# Patient Record
Sex: Female | Born: 1935 | Race: White | Hispanic: No | Marital: Married | Smoking: Never smoker
Health system: Southern US, Community
[De-identification: ages and names within clinical notes are randomized; demographics above are authoritative.]

## PROBLEM LIST (undated history)

## (undated) DIAGNOSIS — I10 Essential (primary) hypertension: Secondary | ICD-10-CM

## (undated) DIAGNOSIS — Z853 Personal history of malignant neoplasm of breast: Secondary | ICD-10-CM

## (undated) DIAGNOSIS — R413 Other amnesia: Secondary | ICD-10-CM

## (undated) DIAGNOSIS — G252 Other specified forms of tremor: Secondary | ICD-10-CM

## (undated) DIAGNOSIS — G459 Transient cerebral ischemic attack, unspecified: Secondary | ICD-10-CM

## (undated) DIAGNOSIS — G250 Essential tremor: Secondary | ICD-10-CM

## (undated) DIAGNOSIS — I7300 Raynaud's syndrome without gangrene: Secondary | ICD-10-CM

## (undated) DIAGNOSIS — E785 Hyperlipidemia, unspecified: Secondary | ICD-10-CM

## (undated) DIAGNOSIS — B0229 Other postherpetic nervous system involvement: Secondary | ICD-10-CM

## (undated) HISTORY — DX: Hyperlipidemia, unspecified: E78.5

## (undated) HISTORY — DX: Personal history of malignant neoplasm of breast: Z85.3

## (undated) HISTORY — DX: Other specified forms of tremor: G25.2

## (undated) HISTORY — DX: Raynaud's syndrome without gangrene: I73.00

## (undated) HISTORY — PX: ABDOMINAL HYSTERECTOMY: SHX81

## (undated) HISTORY — DX: Essential tremor: G25.0

## (undated) HISTORY — DX: Other postherpetic nervous system involvement: B02.29

---

## 1995-03-07 DIAGNOSIS — Z853 Personal history of malignant neoplasm of breast: Secondary | ICD-10-CM

## 1995-03-07 HISTORY — PX: BREAST LUMPECTOMY: SHX2

## 1995-03-07 HISTORY — DX: Personal history of malignant neoplasm of breast: Z85.3

## 1998-12-06 LAB — HM DEXA SCAN

## 2004-08-14 ENCOUNTER — Ambulatory Visit: Payer: Self-pay | Admitting: Internal Medicine

## 2004-10-06 ENCOUNTER — Ambulatory Visit: Payer: Self-pay | Admitting: Internal Medicine

## 2005-08-11 ENCOUNTER — Ambulatory Visit: Payer: Self-pay | Admitting: Internal Medicine

## 2005-09-06 DIAGNOSIS — B0229 Other postherpetic nervous system involvement: Secondary | ICD-10-CM

## 2005-09-06 HISTORY — DX: Other postherpetic nervous system involvement: B02.29

## 2005-09-15 ENCOUNTER — Ambulatory Visit: Payer: Self-pay | Admitting: Internal Medicine

## 2005-10-18 ENCOUNTER — Ambulatory Visit: Payer: Self-pay | Admitting: Internal Medicine

## 2005-12-23 ENCOUNTER — Ambulatory Visit: Payer: Self-pay | Admitting: Internal Medicine

## 2006-01-14 ENCOUNTER — Ambulatory Visit: Payer: Self-pay | Admitting: Internal Medicine

## 2007-01-24 ENCOUNTER — Telehealth (INDEPENDENT_AMBULATORY_CARE_PROVIDER_SITE_OTHER): Payer: Self-pay | Admitting: *Deleted

## 2007-01-25 ENCOUNTER — Ambulatory Visit: Payer: Self-pay | Admitting: Internal Medicine

## 2007-02-13 ENCOUNTER — Ambulatory Visit: Payer: Self-pay | Admitting: Internal Medicine

## 2007-08-29 ENCOUNTER — Ambulatory Visit: Payer: Self-pay | Admitting: Internal Medicine

## 2007-08-29 DIAGNOSIS — E785 Hyperlipidemia, unspecified: Secondary | ICD-10-CM | POA: Insufficient documentation

## 2007-08-29 DIAGNOSIS — K5909 Other constipation: Secondary | ICD-10-CM | POA: Insufficient documentation

## 2007-08-29 DIAGNOSIS — M810 Age-related osteoporosis without current pathological fracture: Secondary | ICD-10-CM | POA: Insufficient documentation

## 2007-08-29 DIAGNOSIS — Z853 Personal history of malignant neoplasm of breast: Secondary | ICD-10-CM

## 2008-01-04 ENCOUNTER — Telehealth (INDEPENDENT_AMBULATORY_CARE_PROVIDER_SITE_OTHER): Payer: Self-pay | Admitting: *Deleted

## 2008-04-30 ENCOUNTER — Ambulatory Visit: Payer: Self-pay | Admitting: Internal Medicine

## 2008-04-30 DIAGNOSIS — G250 Essential tremor: Secondary | ICD-10-CM

## 2008-04-30 DIAGNOSIS — L570 Actinic keratosis: Secondary | ICD-10-CM

## 2008-05-02 LAB — CONVERTED CEMR LAB
Albumin: 3.9 g/dL (ref 3.5–5.2)
Basophils Absolute: 0 10*3/uL (ref 0.0–0.1)
CO2: 30 meq/L (ref 19–32)
Calcium: 9.4 mg/dL (ref 8.4–10.5)
GFR calc Af Amer: 89 mL/min
GFR calc non Af Amer: 74 mL/min
Glucose, Bld: 92 mg/dL (ref 70–99)
Lymphocytes Relative: 15.3 % (ref 12.0–46.0)
MCHC: 34.3 g/dL (ref 30.0–36.0)
Monocytes Absolute: 0.4 10*3/uL (ref 0.1–1.0)
Monocytes Relative: 7.3 % (ref 3.0–12.0)
Neutro Abs: 4.2 10*3/uL (ref 1.4–7.7)
Platelets: 155 10*3/uL (ref 150–400)
Potassium: 4.4 meq/L (ref 3.5–5.1)
RDW: 12.6 % (ref 11.5–14.6)
Sodium: 141 meq/L (ref 135–145)
TSH: 1.94 microintl units/mL (ref 0.35–5.50)

## 2008-05-14 ENCOUNTER — Encounter: Payer: Self-pay | Admitting: Family Medicine

## 2008-05-14 ENCOUNTER — Ambulatory Visit: Payer: Self-pay | Admitting: Internal Medicine

## 2008-05-24 ENCOUNTER — Encounter (INDEPENDENT_AMBULATORY_CARE_PROVIDER_SITE_OTHER): Payer: Self-pay | Admitting: *Deleted

## 2008-11-14 ENCOUNTER — Telehealth: Payer: Self-pay | Admitting: Family Medicine

## 2009-04-22 ENCOUNTER — Telehealth: Payer: Self-pay | Admitting: Internal Medicine

## 2009-06-04 ENCOUNTER — Encounter: Payer: Self-pay | Admitting: Internal Medicine

## 2009-06-04 ENCOUNTER — Ambulatory Visit: Payer: Self-pay | Admitting: Internal Medicine

## 2009-06-06 ENCOUNTER — Encounter: Payer: Self-pay | Admitting: Internal Medicine

## 2009-09-15 ENCOUNTER — Ambulatory Visit: Payer: Self-pay | Admitting: Internal Medicine

## 2009-12-01 ENCOUNTER — Telehealth: Payer: Self-pay | Admitting: Internal Medicine

## 2010-02-13 ENCOUNTER — Telehealth: Payer: Self-pay | Admitting: Internal Medicine

## 2010-02-27 ENCOUNTER — Ambulatory Visit: Payer: Self-pay | Admitting: Internal Medicine

## 2010-02-27 ENCOUNTER — Encounter: Payer: Self-pay | Admitting: Internal Medicine

## 2010-03-11 ENCOUNTER — Ambulatory Visit: Payer: Self-pay | Admitting: General Surgery

## 2010-03-12 ENCOUNTER — Telehealth: Payer: Self-pay | Admitting: Internal Medicine

## 2010-03-20 ENCOUNTER — Encounter: Payer: Self-pay | Admitting: Internal Medicine

## 2010-03-20 ENCOUNTER — Ambulatory Visit: Payer: Self-pay | Admitting: General Surgery

## 2010-03-27 ENCOUNTER — Encounter: Payer: Self-pay | Admitting: Internal Medicine

## 2010-07-24 ENCOUNTER — Ambulatory Visit: Payer: Self-pay | Admitting: Internal Medicine

## 2010-10-06 NOTE — Letter (Signed)
Summary: [facility]  [facility]   Imported By: Lanelle Bal 04/06/2010 10:07:48  _____________________________________________________________________  External Attachment:    Type:   Image     Comment:   External Document  Appended Document: [facility] doing well after cholecystectomy

## 2010-10-06 NOTE — Assessment & Plan Note (Signed)
Summary: STOMACH/CLE   Vital Signs:  Patient profile:   77 year old female Weight:      136 pounds BMI:     22.03 Temp:     99.3 degrees F oral Pulse rate:   76 / minute Pulse rhythm:   regular BP sitting:   140 / 80  (left arm) Cuff size:   regular  Vitals Entered By: Mervin Hack CMA Duncan Dull) (July 24, 2010 8:55 AM) CC: abdominal pain x8days   History of Present Illness: Plans to fly to [place] to see daughter for Thanksgiving--concerned about several things  Has had sore throat for 3 days Got so bad she could barely swallow yesterday but better today no fever but does feel mildly sick some chest congestion occ cough--got up some yellow mucus No sig head or nasal congestion  More concerned about stomach issue  ~2 weeks ago started with terrible stomach pain after breakfast had freq watery stools then---felt faint and diaphoretic Then vomited and was wiped out 2 days later it recurred though not as bad, and then again another couple of days later Always after eating didn't seem to be related to what she ate  Hasn't been moving bowels in between Doesn't remember being constipated before this No fever No heartburn  Allergies: 1)  * Fosamax 2)  Percocet (Oxycodone-Acetaminophen)  Past History:  Past medical, surgical, family and social histories (including risk factors) reviewed for relevance to current acute and chronic problems.  Past Medical History: Reviewed history from 04/30/2008 and no changes required. Breast cancer, hx of--7/96 Hyperlipidemia Osteoporosis Raynaud's Post herpetic neuralgia--1/07 Essential tremor  Past Surgical History: Reviewed history from 09/04/2007 and no changes required. 7/96      lumpectomy - surgery at [facility]     ~ 1970  hysterectomy 4/00      DEXA  Family History: Reviewed history from 09/04/2007 and no changes required. Father: Had macular degeneration, prostate CA Mother: Died AAA, had HTN, osteoporosis Family  history of cerebral aneurysms No  tremor  Social History: Reviewed history from 09/04/2007 and no changes required. Never Smoked Alcohol use-yes, occasional wine, martini Marital Status: Married Children: 2 Occupation: Retired [employer], (occasional part time) Regular exercise-yes, trying - does lift weights Wears seat belt  Review of Systems       appetite is fine weight fairly stable  Physical Exam  General:  alert.  NAD Head:  no sinus tenderness Ears:  R ear normal and L ear normal.   Nose:  no sig inflammation Mouth:  no sig injection or exudates no lesions Neck:  supple, no masses, and no cervical lymphadenopathy.   Lungs:  normal respiratory effort, no intercostal retractions, no accessory muscle use, normal breath sounds, no crackles, and no wheezes.   Abdomen:  soft, normal bowel sounds, and no masses.   Mild diffuse tenderness mostly across upper abdomen Psych:  normally interactive, good eye contact, not anxious appearing, and not depressed appearing.     Impression & Recommendations:  Problem # 1:  ABDOMINAL PAIN (ICD-789.00) Assessment New symptoms suggestive of partial obstruction KUB confirms lots of stool in bowel will have her treat with miralax till clear, then continue daily  Orders: T-1 View Abdomen (KUB) (74000TC)  Problem # 2:  SORE THROAT (ICD-462) Assessment: New probably viral  she has prior amox Rx---she will take this for signs of secondary bacterial infection  Complete Medication List: 1)  Evista 60 Mg Tabs (Raloxifene hcl) .... Take 1 tablet by mouth once  a day 2)  Miralax Powd (Polyethylene glycol 3350) .Marland Kitchen.. 1 capful with water daily to prevent constipation  Patient Instructions: 1)  Please take a full capful of miralax with water three times a day till your bowels really seem to be emptied out. Then please take the miralax daily to prevent similar problems 2)  Please return for your yearly check up   Orders Added: 1)  T-1 View  Abdomen (KUB) [74000TC] 2)  Est. Patient Level IV [88416]   Immunization History:  Influenza Immunization History:    Influenza:  historical (06/06/2010)   Immunization History:  Influenza Immunization History:    Influenza:  Historical (06/06/2010)  Current Allergies (reviewed today): * FOSAMAX PERCOCET (OXYCODONE-ACETAMINOPHEN)

## 2010-10-06 NOTE — Assessment & Plan Note (Signed)
Summary: CHEST CONGESTION/RBH   Vital Signs:  Patient profile:   77 year old female Height:      66 inches Weight:      140 pounds BMI:     22.68 Temp:     100.3 degrees F oral Pulse rate:   64 / minute Pulse rhythm:   regular Resp:     12 per minute BP sitting:   140 / 80  (left arm) Cuff size:   regular  Vitals Entered By: Mervin Hack CMA Duncan Dull) (September 15, 2009 11:30 AM) CC: fever, cold   History of Present Illness: Feels sick Started a couple of days ago Started in chest Some headache, sore throat Mostly pain in chest with cough cough is dry  No fever, chills or night sweats Feels a little SOB from the chest congestion No ear pain  has tried some vitamin C and zinc Toddies with honey and liquor--does calm her cough some  Allergies: 1)  * Fosamax 2)  Percocet (Oxycodone-Acetaminophen)  Past History:  Past medical, surgical, family and social histories (including risk factors) reviewed for relevance to current acute and chronic problems.  Past Medical History: Reviewed history from 04/30/2008 and no changes required. Breast cancer, hx of--7/96 Hyperlipidemia Osteoporosis Raynaud's Post herpetic neuralgia--1/07 Essential tremor  Past Surgical History: Reviewed history from 09/04/2007 and no changes required. 7/96      lumpectomy - surgery at [facility]     ~ 1970  hysterectomy 4/00      DEXA  Family History: Reviewed history from 09/04/2007 and no changes required. Father: Had macular degeneration, prostate CA Mother: Died AAA, had HTN, osteoporosis Family history of cerebral aneurysms No  tremor  Social History: Reviewed history from 09/04/2007 and no changes required. Never Smoked Alcohol use-yes, occasional wine, martini Marital Status: Married Children: 2 Occupation: Retired [employer], (occasional part time) Regular exercise-yes, trying - does lift weights Wears seat belt  Review of Systems       No N/V No diarrhea appetite is  okay  Physical Exam  General:  alert.  NAD Head:  no sinus tenderness Ears:  R ear normal and L ear normal.   Nose:  mild nasal congestion Mouth:  no erythema and no exudates.   Neck:  supple, no masses, and no cervical lymphadenopathy.   Lungs:  normal respiratory effort, no intercostal retractions, no accessory muscle use, normal breath sounds, no crackles, and no wheezes.     Impression & Recommendations:  Problem # 1:  BRONCHITIS- ACUTE (ICD-466.0) Assessment New  seems to be viral discussed supportive care is going on cruise in 5 days---will give Rx in case she worsens before trip  Her updated medication list for this problem includes:    Amoxicillin 500 Mg Tabs (Amoxicillin) .Marland Kitchen... 2 tabs by mouth two times a day for bronchitis  Complete Medication List: 1)  Evista 60 Mg Tabs (Raloxifene hcl) .... Take 1 tablet by mouth once a day 2)  Amoxicillin 500 Mg Tabs (Amoxicillin) .... 2 tabs by mouth two times a day for bronchitis  Patient Instructions: 1)  Please start the amoxicillin if your cough worsens or you start bringing up significant amounts of yellow or green mucus 2)  Please schedule a follow-up appointment as needed .  Prescriptions: AMOXICILLIN 500 MG TABS (AMOXICILLIN) 2 tabs by mouth two times a day for bronchitis  #40 x 0   Entered and Authorized by:   Cindee Salt MD   Signed by:   Gerlene Burdock  Dia Crawford MD on 09/15/2009   Method used:   Electronically to        [facility]* (retail)       [address]       [place], [place]  [address]       Ph: [phone]       Fax: [phone]   RxID:   [id number]   Current Allergies (reviewed today): * FOSAMAX PERCOCET (OXYCODONE-ACETAMINOPHEN)

## 2010-10-06 NOTE — Op Note (Signed)
Summary: Laparoscopic Cholecystectomy/[facility]  Laparoscopic Cholecystectomy/[facility]   Imported By: Lanelle Bal 03/27/2010 12:05:29  _____________________________________________________________________  External Attachment:    Type:   Image     Comment:   External Document

## 2010-10-06 NOTE — Progress Notes (Signed)
Summary: evista  Phone Note Refill Request Call back at Home Phone [phone] Message from:  Patient on December 01, 2009 12:03 PM  Refills Requested: Medication #1:  EVISTA 60 MG TABS Take 1 tablet by mouth once a day Patient wants this sent to [facility]  Initial call taken by: Melody Comas,  December 01, 2009 12:03 PM  Follow-up for Phone Call        Let her know Rx sent Follow-up by: Cindee Salt MD,  December 01, 2009 2:14 PM  Additional Follow-up for Phone Call Additional follow up Details #1::        Spoke with patient and advised results.  Additional Follow-up by: Mervin Hack CMA Duncan Dull),  December 01, 2009 4:31 PM    Prescriptions: EVISTA 60 MG TABS (RALOXIFENE HCL) Take 1 tablet by mouth once a day  #90 x 3   Entered and Authorized by:   Cindee Salt MD   Signed by:   Cindee Salt MD on 12/01/2009   Method used:   Electronically to        [facility]* (mail-order)             ,          Ph: [phone]       Fax: [phone]   RxID:   [id number]

## 2010-10-06 NOTE — Progress Notes (Signed)
Summary: BP up yesterday  Phone Note Call from Patient Call back at Home Phone [phone]   Caller: Patient Call For: Kylie Salt MD Summary of Call: Pt is scheduled for gallbladder surgery on 7/15.  She went to pre admission appt yesterday and her BP was 186/64 and 178/84.  They told her to let you know. Initial call taken by: Lowella Petties CMA,  March 12, 2010 4:35 PM  Follow-up for Phone Call        discussed with husband No history of problems with BP in past so this is probably isolated and related to nerves with upcoming procedure on 7/15 will observe only Follow-up by: Kylie Salt MD,  March 12, 2010 5:48 PM

## 2010-10-06 NOTE — Progress Notes (Signed)
  Phone Note Call from Patient Call back at Home Phone [phone]   Caller: Patient Summary of Call: Called my home last night, I was out at the time Having fairly severe abd pain  I spoke to her husband  ~10PM fairly severe pain around entire abdomen at lower ribs some nausea No fever No vomiting discussed ER if pain worsens-----she is wondering if it is gall bladder and that is certainly possible Initial call taken by: Cindee Salt MD,  February 13, 2010 7:56 AM  Follow-up for Phone Call        Better this AM took ibuprofen, then tylenol feels back to normal actually has had several attacks like this but this was the worst going to beach tomorrow  will check ultrasound on return surgery if positive Follow-up by: Cindee Salt MD,  February 13, 2010 8:02 AM  New Problems: ABDOMINAL PAIN RIGHT UPPER QUADRANT (ICD-789.01)   New Problems: ABDOMINAL PAIN RIGHT UPPER QUADRANT (ICD-789.01)

## 2011-01-01 ENCOUNTER — Other Ambulatory Visit: Payer: Self-pay | Admitting: Internal Medicine

## 2011-11-22 ENCOUNTER — Encounter: Payer: Self-pay | Admitting: Internal Medicine

## 2011-11-22 ENCOUNTER — Ambulatory Visit (INDEPENDENT_AMBULATORY_CARE_PROVIDER_SITE_OTHER): Payer: 59 | Admitting: Internal Medicine

## 2011-11-22 VITALS — BP 148/88 | HR 74 | Temp 98.4°F | Wt 138.0 lb

## 2011-11-22 DIAGNOSIS — R1011 Right upper quadrant pain: Secondary | ICD-10-CM

## 2011-11-22 LAB — CBC WITH DIFFERENTIAL/PLATELET
Basophils Absolute: 0 10*3/uL (ref 0.0–0.1)
Eosinophils Absolute: 0 10*3/uL (ref 0.0–0.7)
HCT: 39.1 % (ref 36.0–46.0)
Hemoglobin: 13 g/dL (ref 12.0–15.0)
Lymphs Abs: 0.6 10*3/uL — ABNORMAL LOW (ref 0.7–4.0)
MCHC: 33.2 g/dL (ref 30.0–36.0)
Monocytes Absolute: 0.7 10*3/uL (ref 0.1–1.0)
Monocytes Relative: 7.8 % (ref 3.0–12.0)
Neutro Abs: 7.5 10*3/uL (ref 1.4–7.7)
Platelets: 160 10*3/uL (ref 150.0–400.0)
RDW: 13.9 % (ref 11.5–14.6)

## 2011-11-22 LAB — BASIC METABOLIC PANEL
BUN: 12 mg/dL (ref 6–23)
CO2: 29 mEq/L (ref 19–32)
Calcium: 9.5 mg/dL (ref 8.4–10.5)
GFR: 65.46 mL/min (ref >60.00)
Glucose, Bld: 105 mg/dL — ABNORMAL HIGH (ref 70–99)

## 2011-11-22 LAB — HEPATIC FUNCTION PANEL
Albumin: 3.7 g/dL (ref 3.5–5.2)
Alkaline Phosphatase: 79 U/L (ref 39–117)
Total Protein: 7 g/dL (ref 6.0–8.3)

## 2011-11-22 LAB — AMYLASE: Amylase: 71 U/L (ref 27–131)

## 2011-11-22 NOTE — Assessment & Plan Note (Signed)
Has pain which radiates around chest and back recreating gallbladder pain from over a year ago Did have cholecystectomy though Doesn't seem to be pulmonary or stomach related Area not consistent with kidney stone though colicky  Will check labs RUQ ultrasound ??choledocholithiasis, other obstruction?

## 2011-11-22 NOTE — Progress Notes (Signed)
  Subjective:    Patient ID: Kylie Kim, female    DOB: June 07, 1928, 78 y.o.   MRN: [id number]  HPI Having "pain attacks" across breasts and back Severe and she feels like she will faint and vomit Started 3 days ago Gets spells that last about 15 minutes--then ease up with nausea for a while Tried zantac and tylenol--no clear help Had 2 again in night last night prompting visit Feels just like pain she had before gall bladder surgery  Not related to meals Appetite is okay---limiting diet due to upper teeth implants Bowels have been fine   no fever No cough or SOB Current Outpatient Prescriptions on File Prior to Visit  Medication Sig Dispense Refill  . EVISTA 60 MG tablet TAKE 1 TABLET DAILY  90 tablet  2    Allergies  Allergen Reactions  . Alendronate Sodium     REACTION: unspecified  . Oxycodone-Acetaminophen     REACTION: unspecified    Past Medical History  Diagnosis Date  . History of breast cancer 07/96  . Hyperlipidemia   . Osteoporosis   . Raynaud's disease   . Post herpetic neuralgia 01/07  . Essential and other specified forms of tremor     Past Surgical History  Procedure Date  . Breast lumpectomy 07/96    surgery at [facility]  . Abdominal hysterectomy     No family history on file.  History   Social History  . Marital Status: Married    Spouse Name: N/A    Number of Children: 2  . Years of Education: N/A   Occupational History  . retired [employer]    Social History Main Topics  . Smoking status: Never Smoker   . Smokeless tobacco: Never Used  . Alcohol Use: Yes  . Drug Use: No  . Sexually Active: Not on file   Other Topics Concern  . Not on file   Social History Narrative  . No narrative on file   Review of Systems Had implant surgery in mouth---soft diet and amoxil. Just finished the amoxil yesterday Had been on pain pills last week---off them before the pains started No rash    Objective:   Physical Exam  Constitutional:  She appears well-developed and well-nourished. No distress.  Neck: Normal range of motion. Neck supple.  Pulmonary/Chest: Effort normal and breath sounds normal. No respiratory distress. She has no wheezes. She has no rales. She exhibits no tenderness.       No dullness  Abdominal: Soft. Bowel sounds are normal. She exhibits no distension and no mass. There is no rebound and no guarding.       Mild RUQ tenderness that recreates pain to some degree  Lymphadenopathy:    She has no cervical adenopathy.          Assessment & Plan:

## 2011-11-22 NOTE — Patient Instructions (Signed)
Please set up the ultrasound

## 2011-11-23 ENCOUNTER — Encounter: Payer: Self-pay | Admitting: Internal Medicine

## 2011-11-23 ENCOUNTER — Ambulatory Visit: Payer: Self-pay | Admitting: Internal Medicine

## 2011-11-24 ENCOUNTER — Encounter: Payer: Self-pay | Admitting: *Deleted

## 2012-05-01 ENCOUNTER — Other Ambulatory Visit: Payer: Self-pay | Admitting: Internal Medicine

## 2012-07-17 ENCOUNTER — Telehealth: Payer: Self-pay

## 2012-07-17 DIAGNOSIS — Z853 Personal history of malignant neoplasm of breast: Secondary | ICD-10-CM

## 2012-07-17 NOTE — Telephone Encounter (Signed)
Pt request date of last mammogram.I see order for mammo 11/22/11 but do not see results.Please advise.

## 2012-07-17 NOTE — Telephone Encounter (Signed)
I don't see it either Can check with the imaging center Okay to order yearly diagnostic mammogram if she is due

## 2012-07-17 NOTE — Telephone Encounter (Signed)
Patient called to have you place an order for a Diagnostic mammogram for history  Of breast cancer. She tried to make her own appt and they wouldn't let her order it said you had to. Wants am appt whrn we schedule it.

## 2012-07-17 NOTE — Telephone Encounter (Signed)
Last mammogram 06/04/2009, per pt she stated she had one last year, I didn't see it. Pt states she's a breast cancer survivor and wouldn't miss a year. Please advise

## 2012-07-26 ENCOUNTER — Ambulatory Visit: Payer: Self-pay | Admitting: Internal Medicine

## 2012-07-27 ENCOUNTER — Encounter: Payer: Self-pay | Admitting: Internal Medicine

## 2012-07-31 ENCOUNTER — Encounter: Payer: Self-pay | Admitting: *Deleted

## 2013-02-27 ENCOUNTER — Other Ambulatory Visit: Payer: Self-pay | Admitting: Internal Medicine

## 2013-02-27 MED ORDER — RALOXIFENE HCL 60 MG PO TABS: 60.0000 mg | ORAL_TABLET | Freq: Every day | ORAL | Status: DC

## 2013-02-27 NOTE — Telephone Encounter (Signed)
Pt scheduled appt

## 2013-04-23 ENCOUNTER — Encounter: Payer: Self-pay | Admitting: Internal Medicine

## 2013-04-23 ENCOUNTER — Ambulatory Visit (INDEPENDENT_AMBULATORY_CARE_PROVIDER_SITE_OTHER): Payer: Medicare Other | Admitting: Internal Medicine

## 2013-04-23 VITALS — BP 140/80 | HR 68 | Temp 97.8°F | Ht 66.0 in | Wt 138.0 lb

## 2013-04-23 DIAGNOSIS — Z853 Personal history of malignant neoplasm of breast: Secondary | ICD-10-CM

## 2013-04-23 DIAGNOSIS — Z0000 Encounter for general adult medical examination without abnormal findings: Secondary | ICD-10-CM | POA: Insufficient documentation

## 2013-04-23 DIAGNOSIS — G250 Essential tremor: Secondary | ICD-10-CM

## 2013-04-23 DIAGNOSIS — G501 Atypical facial pain: Secondary | ICD-10-CM

## 2013-04-23 LAB — CBC WITH DIFFERENTIAL/PLATELET
Basophils Absolute: 0 10*3/uL (ref 0.0–0.1)
HCT: 37.8 % (ref 36.0–46.0)
Hemoglobin: 12.8 g/dL (ref 12.0–15.0)
Lymphs Abs: 0.9 10*3/uL (ref 0.7–4.0)
MCV: 90.9 fl (ref 78.0–100.0)
Monocytes Absolute: 0.4 10*3/uL (ref 0.1–1.0)
Monocytes Relative: 6.9 % (ref 3.0–12.0)
Neutro Abs: 4.1 10*3/uL (ref 1.4–7.7)
RDW: 13.6 % (ref 11.5–14.6)

## 2013-04-23 LAB — BASIC METABOLIC PANEL
CO2: 28 mEq/L (ref 19–32)
Chloride: 103 mEq/L (ref 96–112)
GFR: 76.1 mL/min (ref >60.00)
Glucose, Bld: 89 mg/dL (ref 70–99)
Potassium: 3.8 mEq/L (ref 3.5–5.1)
Sodium: 138 mEq/L (ref 135–145)

## 2013-04-23 LAB — HEPATIC FUNCTION PANEL
AST: 22 U/L (ref 0–37)
Albumin: 4.2 g/dL (ref 3.5–5.2)
Alkaline Phosphatase: 72 U/L (ref 39–117)
Total Protein: 7.1 g/dL (ref 6.0–8.3)

## 2013-04-23 NOTE — Assessment & Plan Note (Signed)
Mild in head only No Rx indicated as no functional problems

## 2013-04-23 NOTE — Assessment & Plan Note (Signed)
Left V2 by her history Never had rash Discussed it might not be shingles Only when supine Doesn't want Rx

## 2013-04-23 NOTE — Progress Notes (Signed)
Subjective:    Patient ID: Kylie Kim, female    DOB: 1927-10-14, 79 y.o.   MRN: [id number]  HPI Here for physical  Had bad bout of shingles pain--but never had the rash Pain persists in left V2 dermatome---only notices if she is supine Gets burning and itching Worse with heat Does ease up over time so she doesn't want Rx  Gets sense of swelling and knots in right breast--where she had lumpectomy Did have mammo in November  Feels she had "a mini stroke" 6 months ago or so Came in after errands--- had headache and then zig zag vision Tried 2 advil--then couldn't see much for about a minute (due to the zig zag pattern) Went away with a minute or so Brief trouble forming a thought No recurrent symptoms  Current Outpatient Prescriptions on File Prior to Visit  Medication Sig Dispense Refill  . raloxifene (EVISTA) 60 MG tablet Take 1 tablet (60 mg total) by mouth daily.  90 tablet  1   No current facility-administered medications on file prior to visit.    Allergies  Allergen Reactions  . Alendronate Sodium     REACTION: unspecified  . Oxycodone-Acetaminophen     REACTION: unspecified    Past Medical History  Diagnosis Date  . History of breast cancer 07/96  . Hyperlipidemia   . Osteoporosis   . Raynaud's disease   . Post herpetic neuralgia 01/07  . Essential and other specified forms of tremor     Past Surgical History  Procedure Laterality Date  . Breast lumpectomy  07/96    surgery at [facility]  . Abdominal hysterectomy      No family history on file.  History   Social History  . Marital Status: Married    Spouse Name: N/A    Number of Children: 2  . Years of Education: N/A   Occupational History  . retired [employer]    Social History Main Topics  . Smoking status: Never Smoker   . Smokeless tobacco: Never Used  . Alcohol Use: Yes  . Drug Use: No  . Sexual Activity: Not on file   Other Topics Concern  . Not on file   Social History  Narrative   Has living will   Would want husband, then son Link Snuffer, should make health care decisions.   Would accept resuscitation attempts but no prolonged ventilation   Probably would not want tube feeds if cognitively unaware         Review of Systems  Constitutional: Negative for fatigue and unexpected weight change.       Wears seat belt  HENT: Positive for hearing loss and dental problem. Negative for congestion, rhinorrhea and tinnitus.        Lots of visits to periodontist and dentist ?slight hearing loss  Eyes: Positive for visual disturbance.       Regular with eye exams  Respiratory: Negative for cough, chest tightness and shortness of breath.   Cardiovascular: Negative for chest pain, palpitations and leg swelling.  Gastrointestinal: Negative for nausea, vomiting, abdominal pain, constipation and blood in stool.       No heartburn  Endocrine: Negative for cold intolerance and heat intolerance.  Genitourinary: Positive for frequency. Negative for difficulty urinating.       Uses liner for slight urge incontinence No sexual problems  Musculoskeletal: Positive for arthralgias. Negative for back pain and joint swelling.       Some arthritis pain in fingers now--no meds  Skin:  Negative for rash.       Sees Dr Roseanne Kaufman  Allergic/Immunologic: Negative for environmental allergies and immunocompromised state.  Neurological: Positive for headaches. Negative for dizziness, syncope, weakness, light-headedness and numbness.  Hematological: Negative for adenopathy. Does not bruise/bleed easily.  Psychiatric/Behavioral: Negative for sleep disturbance and dysphoric mood. The patient is not nervous/anxious.        Objective:   Physical Exam  Constitutional: She is oriented to person, place, and time. She appears well-developed and well-nourished. No distress.  HENT:  Head: Normocephalic and atraumatic.  Right Ear: External ear normal.  Left Ear: External ear normal.   Mouth/Throat: Oropharynx is clear and moist. No oropharyngeal exudate.  Eyes: Conjunctivae and EOM are normal. Pupils are equal, round, and reactive to light.  Neck: Normal range of motion. Neck supple. No thyromegaly present.  Cardiovascular: Normal rate, regular rhythm, normal heart sounds and intact distal pulses.  Exam reveals no gallop.   No murmur heard. Pulmonary/Chest: Effort normal and breath sounds normal. No respiratory distress. She has no wheezes. She has no rales.  Abdominal: Soft. There is no tenderness.  Genitourinary:  Thickening in right breast around 8-9 o'clock Mild cystic changes bilaterally No sig mass  Musculoskeletal: She exhibits no edema and no tenderness.  Lymphadenopathy:    She has no cervical adenopathy.    She has no axillary adenopathy.  Neurological: She is alert and oriented to person, place, and time.  Skin: No rash noted. No erythema.  Psychiatric: She has a normal mood and affect. Her behavior is normal.          Assessment & Plan:

## 2013-04-23 NOTE — Assessment & Plan Note (Signed)
I only feel radiation damaged tissue Reassured her May want to stop mammograms before too long

## 2013-04-23 NOTE — Assessment & Plan Note (Signed)
Healthy Counseling done 

## 2013-05-23 ENCOUNTER — Telehealth: Payer: Self-pay

## 2013-05-23 NOTE — Telephone Encounter (Signed)
Pt said her computer is in shop and cannot get to mychart. Pt notifed as instructed by mychart lab notes. Pt request copy mailed to verified home address. Done.

## 2013-07-12 ENCOUNTER — Other Ambulatory Visit: Payer: Self-pay

## 2013-08-14 ENCOUNTER — Other Ambulatory Visit: Payer: Self-pay | Admitting: *Deleted

## 2013-08-14 MED ORDER — RALOXIFENE HCL 60 MG PO TABS: 60.0000 mg | ORAL_TABLET | Freq: Every day | ORAL | Status: DC

## 2013-10-12 ENCOUNTER — Ambulatory Visit: Payer: Self-pay | Admitting: Internal Medicine

## 2013-10-15 ENCOUNTER — Encounter: Payer: Self-pay | Admitting: Internal Medicine

## 2013-12-20 ENCOUNTER — Ambulatory Visit: Payer: Self-pay | Admitting: Podiatry

## 2013-12-20 ENCOUNTER — Ambulatory Visit: Payer: Self-pay | Admitting: Podiatrist

## 2013-12-27 ENCOUNTER — Ambulatory Visit: Payer: Self-pay | Admitting: Podiatry

## 2014-01-14 ENCOUNTER — Ambulatory Visit (INDEPENDENT_AMBULATORY_CARE_PROVIDER_SITE_OTHER): Payer: Medicare Other | Admitting: Podiatry

## 2014-01-14 ENCOUNTER — Encounter: Payer: Self-pay | Admitting: Podiatry

## 2014-01-14 VITALS — BP 153/79 | HR 82 | Resp 16 | Ht 60.0 in

## 2014-01-14 DIAGNOSIS — Q828 Other specified congenital malformations of skin: Secondary | ICD-10-CM

## 2014-01-14 NOTE — Progress Notes (Signed)
   Subjective:    Patient ID: Kylie Kim, female    DOB: 1928/05/22, 80 y.o.   MRN: [id number]  HPI Comments: i have a place on the bottom of my left foot. It hurts when i put weight on it. i use padding on it and neosporin. Its remained the same. Ive had it for 6 months to 1 year.   Foot Pain      Review of Systems  All other systems reviewed and are negative.      Objective:   Physical Exam: I have reviewed her past medical history medications allergies surgeries and social history. Pulses are palpable bilateral. Neurologic sensorium is intact. Deep tendon reflexes are intact. Muscle strength is 5 over 5 dorsiflexors plantar flexors inverters everters all intrinsic musculature is intact. Orthopedic evaluation demonstrates all joints distal to the ankle a full range of motion without crepitation. Mild hammertoe deformities noted bilateral. Cutaneous evaluation demonstrates supple well hydrated cutis reactive hyperkeratotic lesion sub-second metatarsophalangeal joint that appears to be a porokeratotic lesion.          Assessment & Plan:  Assessment: Porokeratosis.  Plan: Debridement of porokeratosis and placement padding.

## 2014-06-10 ENCOUNTER — Other Ambulatory Visit: Payer: Self-pay | Admitting: Internal Medicine

## 2014-06-28 ENCOUNTER — Ambulatory Visit (INDEPENDENT_AMBULATORY_CARE_PROVIDER_SITE_OTHER): Payer: Medicare Other

## 2014-06-28 DIAGNOSIS — Z23 Encounter for immunization: Secondary | ICD-10-CM

## 2014-10-23 ENCOUNTER — Other Ambulatory Visit: Payer: Self-pay | Admitting: Internal Medicine

## 2015-01-06 ENCOUNTER — Encounter: Payer: Self-pay | Admitting: Internal Medicine

## 2015-01-06 ENCOUNTER — Ambulatory Visit (INDEPENDENT_AMBULATORY_CARE_PROVIDER_SITE_OTHER): Payer: Medicare Other | Admitting: Internal Medicine

## 2015-01-06 VITALS — BP 150/80 | HR 72 | Temp 98.0°F | Ht 60.0 in | Wt 140.0 lb

## 2015-01-06 DIAGNOSIS — G501 Atypical facial pain: Secondary | ICD-10-CM | POA: Diagnosis not present

## 2015-01-06 DIAGNOSIS — Z7189 Other specified counseling: Secondary | ICD-10-CM

## 2015-01-06 DIAGNOSIS — Z23 Encounter for immunization: Secondary | ICD-10-CM | POA: Diagnosis not present

## 2015-01-06 DIAGNOSIS — Z0000 Encounter for general adult medical examination without abnormal findings: Secondary | ICD-10-CM | POA: Diagnosis not present

## 2015-01-06 DIAGNOSIS — Z853 Personal history of malignant neoplasm of breast: Secondary | ICD-10-CM | POA: Diagnosis not present

## 2015-01-06 DIAGNOSIS — G250 Essential tremor: Secondary | ICD-10-CM

## 2015-01-06 DIAGNOSIS — G252 Other specified forms of tremor: Secondary | ICD-10-CM

## 2015-01-06 DIAGNOSIS — R251 Tremor, unspecified: Secondary | ICD-10-CM

## 2015-01-06 NOTE — Assessment & Plan Note (Signed)
Mild stable head tremor No meds needed

## 2015-01-06 NOTE — Assessment & Plan Note (Signed)
Mild and intermittent No bad enough for treatment

## 2015-01-06 NOTE — Progress Notes (Signed)
Subjective:    Patient ID: Kylie Kim, female    DOB: 1927/09/25, 81 y.o.   MRN: [id number]  HPI Here for Medicare wellness and follow up of chronic medical problems Reviewed form and advanced directives Only sees eye doctor Occasional drink--at most one a day No tobacco products Tries to [facility]s Vision okay---early cataract. Discussed avoiding the bifocals Hearing is okay No falls No depression or anhedonia No major cognitive problems--memory is not as good though  Still has intermittent pain in her face--around left ear Burning and itchingh Seems more noticeable with heat--like after shower May go weeks pain free--then other times it hurts every few days  Tremor seems to be somewhat better It really doesn't bother her at all  She continues on the evista She has always just kept up with the mammograms Still gets some numbness along lateral right breast Cancer was about 20 years ago  Current Outpatient Prescriptions on File Prior to Visit  Medication Sig Dispense Refill  . Calcium Citrate-Vitamin D (CITRUS CALCIUM 1500 + D PO) Take 1 tablet by mouth daily.    . Multiple Vitamins-Minerals (ABC PLUS SENIOR) TABS Take 1 tablet by mouth daily.    . polyethylene glycol powder (GLYCOLAX/MIRALAX) powder Take 17 g by mouth every other day.    . raloxifene (EVISTA) 60 MG tablet TAKE 1 TABLET DAILY 90 tablet 1   No current facility-administered medications on file prior to visit.    Allergies  Allergen Reactions  . Alendronate Sodium     REACTION: unspecified  . Oxycodone-Acetaminophen     REACTION: unspecified    Past Medical History  Diagnosis Date  . History of breast cancer 07/96  . Hyperlipidemia   . Osteoporosis   . Raynaud's disease   . Post herpetic neuralgia 01/07  . Essential and other specified forms of tremor     Past Surgical History  Procedure Laterality Date  . Breast  lumpectomy  07/96    [facility]  . Abdominal hysterectomy      No family history on file.  History   Social History  . Marital Status: Married    Spouse Name: N/A  . Number of Children: 2  . Years of Education: N/A   Occupational History  . retired [employer]    Social History Main Topics  . Smoking status: Never Smoker   . Smokeless tobacco: Never Used  . Alcohol Use: Yes  . Drug Use: No  . Sexual Activity: Not on file   Other Topics Concern  . Not on file   Social History Narrative   Has living will   Would want husband, then son Tacey HeapScottie, should make health care decisions. (not positive she has health care POA)   Would accept resuscitation attempts but no prolonged ventilation   Does not want tube feeds if cognitively unaware         Review of Systems Bad urinary frequency--no pain or hematuria. Has to wear pad just in case Bowels are great with the miralax--every other night Sleeps well--despite 4-5 per night nocturia Appetite is good Teeth fine--regular with dentist (and periodontist) Mild arthritic changes in DIPs in hands--but don't hurt     Objective:   Physical Exam  Constitutional: She is oriented to person, place, and time. She appears well-developed and well-nourished. No distress.  HENT:  Mouth/Throat: Oropharynx is clear and moist. No oropharyngeal exudate.  Neck: Normal range of motion. Neck supple. No thyromegaly  present.  Cardiovascular: Normal rate, regular rhythm, normal heart sounds and intact distal pulses.  Exam reveals no gallop.   No murmur heard. Pulmonary/Chest: Effort normal and breath sounds normal. No respiratory distress. She has no wheezes. She has no rales.  Abdominal: Soft. There is no tenderness.  Musculoskeletal: She exhibits no edema or tenderness.  Lymphadenopathy:    She has no cervical adenopathy.  Neurological: She is alert and oriented to person, place, and time.  President -- "Obama, Bill Clinton----then Danae Orleans, the  son" [phone] D-[id number] Recall 3/3  Skin: No rash noted. No erythema.  Psychiatric: She has a normal mood and affect. Her behavior is normal.          Assessment & Plan:

## 2015-01-06 NOTE — Patient Instructions (Signed)
Please get me a copy of your health care power of attorney. 

## 2015-01-06 NOTE — Addendum Note (Signed)
Addended by: Sueanne MargaritaSMITH, DESHANNON L on: 01/06/2015 05:08 PM   Modules accepted: Orders

## 2015-01-06 NOTE — Assessment & Plan Note (Signed)
See social history 

## 2015-01-06 NOTE — Assessment & Plan Note (Signed)
Continues on raloxifene Discussed---will no longer do exams or mammograms

## 2015-01-06 NOTE — Progress Notes (Signed)
Pre visit review using our clinic review tool, if applicable. No additional management support is needed unless otherwise documented below in the visit note. 

## 2015-01-06 NOTE — Assessment & Plan Note (Signed)
I have personally reviewed the Medicare Annual Wellness questionnaire and have noted 1. The patient's medical and social history 2. Their use of alcohol, tobacco or illicit drugs 3. Their current medications and supplements 4. The patient's functional ability including ADL's, fall risks, home safety risks and hearing or visual             impairment. 5. Diet and physical activities 6. Evidence for depression or mood disorders  The patients weight, height, BMI and visual acuity have been recorded in the chart I have made referrals, counseling and provided education to the patient based review of the above and I have provided the pt with a written personalized care plan for preventive services.  I have provided you with a copy of your personalized plan for preventive services. Please take the time to review along with your updated medication list.  prevnar today Yearly flu shots No cancer screening due to age

## 2015-02-19 ENCOUNTER — Ambulatory Visit (INDEPENDENT_AMBULATORY_CARE_PROVIDER_SITE_OTHER): Payer: Medicare Other | Admitting: Primary Care

## 2015-02-19 ENCOUNTER — Other Ambulatory Visit: Payer: Self-pay | Admitting: Primary Care

## 2015-02-19 ENCOUNTER — Encounter: Payer: Self-pay | Admitting: Primary Care

## 2015-02-19 VITALS — BP 148/80 | HR 70 | Temp 98.3°F | Ht 60.0 in | Wt 138.8 lb

## 2015-02-19 DIAGNOSIS — I739 Peripheral vascular disease, unspecified: Secondary | ICD-10-CM

## 2015-02-19 DIAGNOSIS — M79661 Pain in right lower leg: Secondary | ICD-10-CM

## 2015-02-19 NOTE — Patient Instructions (Signed)
Stop by the front and speak with [facility] regarding your Ultrasound and ABI's. I would prefer you obtain the Ultrasound tomorrow if possible.   I will notify you of the results once I receive them.  It was nice meeting you!

## 2015-02-19 NOTE — Progress Notes (Signed)
Pre visit review using our clinic review tool, if applicable. No additional management support is needed unless otherwise documented below in the visit note. 

## 2015-02-19 NOTE — Progress Notes (Signed)
Subjective:    Patient ID: Kylie Kim, female    DOB: 07-21-1928, 81 y.o.   MRN: [id number]  HPI  Ms. Dorminey is an 81 year old female who presents today with a chief complaint of leg pain. Her pain is located to the right calf and has been present for the past 10 days intermittently. Her pain only presents when walking and is severe, sharp, throbbing when it occurs. She's taking Advil 200 mg daily. At rest she will occaionally experience a dull ache. Denies swelling, redness, tenderness upon palpation. Her pain is worse with walking and is only located to the right lower extremity.   Review of Systems  Constitutional: Negative for fever and chills.  Respiratory: Negative for shortness of breath.   Cardiovascular: Negative for chest pain.  Musculoskeletal: Positive for myalgias.  Neurological: Negative for dizziness.       Past Medical History  Diagnosis Date  . History of breast cancer 07/96  . Hyperlipidemia   . Osteoporosis   . Raynaud's disease   . Post herpetic neuralgia 01/07  . Essential and other specified forms of tremor     History   Social History  . Marital Status: Married    Spouse Name: N/A  . Number of Children: 2  . Years of Education: N/A   Occupational History  . retired [employer]    Social History Main Topics  . Smoking status: Never Smoker   . Smokeless tobacco: Never Used  . Alcohol Use: Yes  . Drug Use: No  . Sexual Activity: Not on file   Other Topics Concern  . Not on file   Social History Narrative   Has living will   Would want husband, then son Tacey Heap, should make health care decisions. (not positive she has health care POA)   Would accept resuscitation attempts but no prolonged ventilation   Does not want tube feeds if cognitively unaware          Past Surgical History  Procedure Laterality Date  . Breast lumpectomy  07/96    [facility] at [facility]  . Abdominal hysterectomy      No family history on file.  Allergies    Allergen Reactions  . Alendronate Sodium     REACTION: unspecified  . Oxycodone-Acetaminophen     REACTION: unspecified    Current Outpatient Prescriptions on File Prior to Visit  Medication Sig Dispense Refill  . Calcium Citrate-Vitamin D (CITRUS CALCIUM 1500 + D PO) Take 1 tablet by mouth daily.    . Multiple Vitamins-Minerals (ABC PLUS SENIOR) TABS Take 1 tablet by mouth daily.    . polyethylene glycol powder (GLYCOLAX/MIRALAX) powder Take 17 g by mouth every other day.    . raloxifene (EVISTA) 60 MG tablet TAKE 1 TABLET DAILY 90 tablet 1   No current facility-administered medications on file prior to visit.    BP 148/80 mmHg  Pulse 70  Temp(Src) 98.3 F (36.8 C) (Oral)  Ht 5' (1.524 m)  Wt 138 lb 12.8 oz (62.959 kg)  BMI 27.11 kg/m2  SpO2 96%    Objective:   Physical Exam  Cardiovascular: Normal rate and regular rhythm.   Pulmonary/Chest: Effort normal and breath sounds normal.  Musculoskeletal:       Right lower leg: She exhibits no tenderness and no swelling.  No erythema or tenderness upon palpation.  Skin: Skin is warm and dry. No rash noted.          Assessment & Plan:  DVT vs. PAD  Pain during ambulation to right calf only x 10 days. No recent air travel or long car travel. No dependent rubor.  Suspect PAD due to intermittent claudication. Will get ABI's;  Venous ultrasound to rule out DVT. Will notify patient of results once tests are preformed.

## 2015-02-20 ENCOUNTER — Telehealth: Payer: Self-pay | Admitting: Primary Care

## 2015-02-20 ENCOUNTER — Ambulatory Visit (INDEPENDENT_AMBULATORY_CARE_PROVIDER_SITE_OTHER): Payer: Medicare Other

## 2015-02-20 DIAGNOSIS — M79661 Pain in right lower leg: Secondary | ICD-10-CM | POA: Diagnosis not present

## 2015-02-20 NOTE — Telephone Encounter (Signed)
Received phone call today reporting that ultrasound was negative for DVT. Will you please ensure patient is aware? Thanks!

## 2015-02-21 ENCOUNTER — Telehealth: Payer: Self-pay | Admitting: Internal Medicine

## 2015-02-21 NOTE — Telephone Encounter (Signed)
I left a message for the patient to return my call.

## 2015-02-21 NOTE — Telephone Encounter (Signed)
Pt returned your call. She is leaving town for [place] and will have limited cell service. Please call back Monday, thanks

## 2015-02-21 NOTE — Telephone Encounter (Signed)
Spoke with patient regarding results. She had no further questions.

## 2015-02-24 NOTE — Telephone Encounter (Signed)
Pt returned your call, she says that she has a missed call on her phone.  Please return call., thanks

## 2015-02-24 NOTE — Telephone Encounter (Signed)
I left a message for the patient to return my call.

## 2015-02-24 NOTE — Telephone Encounter (Signed)
Called and notified patient of Kate's comments. Patient verbalized understanding.  

## 2015-02-28 DIAGNOSIS — M79661 Pain in right lower leg: Secondary | ICD-10-CM | POA: Diagnosis not present

## 2015-03-03 ENCOUNTER — Ambulatory Visit (INDEPENDENT_AMBULATORY_CARE_PROVIDER_SITE_OTHER): Payer: Medicare Other

## 2015-03-03 ENCOUNTER — Other Ambulatory Visit: Payer: Self-pay | Admitting: Primary Care

## 2015-03-03 DIAGNOSIS — M79661 Pain in right lower leg: Secondary | ICD-10-CM

## 2015-03-03 DIAGNOSIS — I739 Peripheral vascular disease, unspecified: Secondary | ICD-10-CM

## 2015-03-04 ENCOUNTER — Telehealth: Payer: Self-pay | Admitting: Primary Care

## 2015-03-04 DIAGNOSIS — I70201 Unspecified atherosclerosis of native arteries of extremities, right leg: Secondary | ICD-10-CM

## 2015-03-04 NOTE — Telephone Encounter (Signed)
Notified patient of results. Urgent referral made to vascular surgery.

## 2015-07-29 ENCOUNTER — Other Ambulatory Visit: Payer: Self-pay | Admitting: Internal Medicine

## 2015-12-22 ENCOUNTER — Other Ambulatory Visit: Payer: Self-pay | Admitting: Internal Medicine

## 2016-01-08 ENCOUNTER — Encounter: Payer: Medicare Other | Admitting: Internal Medicine

## 2016-01-13 ENCOUNTER — Ambulatory Visit (INDEPENDENT_AMBULATORY_CARE_PROVIDER_SITE_OTHER): Payer: Medicare Other

## 2016-01-13 VITALS — BP 132/90 | HR 72 | Temp 98.7°F | Ht 63.0 in | Wt 137.5 lb

## 2016-01-13 DIAGNOSIS — Z0000 Encounter for general adult medical examination without abnormal findings: Secondary | ICD-10-CM

## 2016-01-13 NOTE — Progress Notes (Signed)
Pre visit review using our clinic review tool, if applicable. No additional management support is needed unless otherwise documented below in the visit note. 

## 2016-01-13 NOTE — Patient Instructions (Signed)
Ms. Rockne MenghiniShowerman , Thank you for taking time to come for your Medicare Wellness Visit. I appreciate your ongoing commitment to your health goals. Please review the following plan we discussed and let me know if I can assist you in the future.   These are the goals we discussed: Goals    . Reduce portion size     Starting 01/13/2016, I will continue to monitor my portion sizes during meals in an effort to lose 5 lbs.        This is a list of the screening recommended for you and due dates:  Health Maintenance  Topic Date Due  . Tetanus Vaccine  01/07/2017*  . Shingles Vaccine  09/06/2048*  . Flu Shot  04/06/2016  . DEXA scan (bone density measurement)  Completed  . Pneumonia vaccines  Completed  *Topic was postponed. The date shown is not the original due date.   Preventive Care for Adults  A healthy lifestyle and preventive care can promote health and wellness. Preventive health guidelines for adults include the following key practices.  . A routine yearly physical is a good way to check with your health care provider about your health and preventive screening. It is a chance to share any concerns and updates on your health and to receive a thorough exam.  . Visit your dentist for a routine exam and preventive care every 6 months. Brush your teeth twice a day and floss once a day. Good oral hygiene prevents tooth decay and gum disease.  . The frequency of eye exams is based on your age, health, family medical history, use  of contact lenses, and other factors. Follow your health care provider's ecommendations for frequency of eye exams.  . Eat a healthy diet. Foods like vegetables, fruits, whole grains, low-fat dairy products, and lean protein foods contain the nutrients you need without too many calories. Decrease your intake of foods high in solid fats, added sugars, and salt. Eat the right amount of calories for you. Get information about a proper diet from your health care provider,  if necessary.  . Regular physical exercise is one of the most important things you can do for your health. Most adults should get at least 150 minutes of moderate-intensity exercise (any activity that increases your heart rate and causes you to sweat) each week. In addition, most adults need muscle-strengthening exercises on 2 or more days a week.  Silver Sneakers may be a benefit available to you. To determine eligibility, you may visit the website: www.silversneakers.com or contact program at [phone] Mon-Fri between 8AM-8PM.   . Maintain a healthy weight. The body mass index (BMI) is a screening tool to identify possible weight problems. It provides an estimate of body fat based on height and weight. Your health care provider can find your BMI and can help you achieve or maintain a healthy weight.   For adults 20 years and older: ? A BMI below 18.5 is considered underweight. ? A BMI of 18.5 to 24.9 is normal. ? A BMI of 25 to 29.9 is considered overweight. ? A BMI of 30 and above is considered obese.   . Maintain normal blood lipids and cholesterol levels by exercising and minimizing your intake of saturated fat. Eat a balanced diet with plenty of fruit and vegetables. Blood tests for lipids and cholesterol should begin at age 80 and be repeated every 5 years. If your lipid or cholesterol levels are high, you are over 50, or you are at  high risk for heart disease, you may need your cholesterol levels checked more frequently. Ongoing high lipid and cholesterol levels should be treated with medicines if diet and exercise are not working.  . If you smoke, find out from your health care provider how to quit. If you do not use tobacco, please do not start.  . If you choose to drink alcohol, please do not consume more than 2 drinks per day. One drink is considered to be 12 ounces (355 mL) of beer, 5 ounces (148 mL) of wine, or 1.5 ounces (44 mL) of liquor.  . If you are 11-29 years old, ask  your health care provider if you should take aspirin to prevent strokes.  . Use sunscreen. Apply sunscreen liberally and repeatedly throughout the day. You should seek shade when your shadow is shorter than you. Protect yourself by wearing long sleeves, pants, a wide-brimmed hat, and sunglasses year round, whenever you are outdoors.  . Once a month, do a whole body skin exam, using a mirror to look at the skin on your back. Tell your health care provider of new moles, moles that have irregular borders, moles that are larger than a pencil eraser, or moles that have changed in shape or color.

## 2016-01-13 NOTE — Progress Notes (Signed)
PCP notes:  Health maintenance: Pt will contact insurance to determine if tetanus vaccine is covered.  Screenings: Please review hearing results and address any concerns with pt. Pt was encouraged to schedule an appt with PCP to discuss hearing.

## 2016-01-13 NOTE — Progress Notes (Signed)
   Subjective:    Patient ID: Kylie Kim, female    DOB: 09-04-1928, 82 y.o.   MRN: [id number]  HPI I reviewed health advisor's note, was available for consultation, and agree with documentation and plan.    Review of Systems     Objective:   Physical Exam        Assessment & Plan:

## 2016-01-13 NOTE — Progress Notes (Signed)
Subjective:   Kylie Kim is a 82 y.o. female who presents for Medicare Annual (Subsequent) preventive examination.  Review of Systems:  N/A Cardiac Risk Factors include: advanced age (>955men, 62>65 women);dyslipidemia     Objective:     Vitals: BP 132/90 mmHg  Pulse 72  Temp(Src) 98.7 F (37.1 C) (Oral)  Ht 5\' 3"  (1.6 m)  Wt 137 lb 8 oz (62.37 kg)  BMI 24.36 kg/m2  SpO2 98%  Body mass index is 24.36 kg/(m^2).   Tobacco History  Smoking status  . Never Smoker   Smokeless tobacco  . Never Used     Counseling given: No   Past Medical History  Diagnosis Date  . History of breast cancer 07/96  . Hyperlipidemia   . Osteoporosis   . Raynaud's disease   . Post herpetic neuralgia 01/07  . Essential and other specified forms of tremor    Past Surgical History  Procedure Laterality Date  . Breast lumpectomy  07/96    [facility]  . Abdominal hysterectomy     History reviewed. No pertinent family history. History  Sexual Activity  . Sexual Activity: No    Outpatient Encounter Prescriptions as of 01/13/2016  Medication Sig  . Calcium Citrate-Vitamin D (CITRUS CALCIUM 1500 + D PO) Take 1 tablet by mouth daily.  . Multiple Vitamins-Minerals (ABC PLUS SENIOR) TABS Take 1 tablet by mouth daily.  . polyethylene glycol powder (GLYCOLAX/MIRALAX) powder Take 17 g by mouth every other day.  . raloxifene (EVISTA) 60 MG tablet TAKE 1 TABLET DAILY   No facility-administered encounter medications on file as of 01/13/2016.    Activities of Daily Living In your present state of health, do you have any difficulty performing the following activities: 01/13/2016  Hearing? N  Vision? N  Difficulty concentrating or making decisions? N  Walking or climbing stairs? N  Dressing or bathing? N  Doing errands, shopping? N  Preparing Food and eating ? N  Using the Toilet? N  In the past six months, have you accidently leaked urine? Y  Do you have problems with loss of  bowel control? N  Managing your Medications? N  Managing your Finances? N  Housekeeping or managing your Housekeeping? N    Patient Care Team: Karie Schwalbeichard I Letvak, MD as PCP - General Galen ManilaWilliam Porfilio, MD as Referring Physician (Ophthalmology) Nolon BussingMichael J. Touloupas, MD as Consulting Physician (Dentistry) Sherlon HandingPaul Byerly, DDS as Referring Physician (Orthodontics) Debbrah AlarArin L Isenstein, MD as Consulting Physician (Dermatology) Annice NeedyJason S Dew, MD as Referring Physician (Vascular Surgery)    Assessment:     Hearing Screening   125Hz  250Hz  500Hz  1000Hz  2000Hz  4000Hz  8000Hz   Right ear:   0 0 40 0   Left ear:   40 0 40 0   Vision Screening Comments: Last eye exam in 2015 with Dr. Lelon FrohlichPorfolio   Exercise Activities and Dietary recommendations Current Exercise Habits: Home exercise routine, Type of exercise: strength training/weights, Time (Minutes): 60, Frequency (Times/Week): 2, Weekly Exercise (Minutes/Week): 120, Intensity: Moderate, Exercise limited by: None identified  Goals    . Reduce portion size     Starting 01/13/2016, I will continue to monitor my portion sizes during meals in an effort to lose 5 lbs.       Fall Risk Fall Risk  01/13/2016 01/06/2015 04/23/2013  Falls in the past year? No No No   Depression Screen PHQ 2/9 Scores 01/13/2016 01/06/2015 04/23/2013  PHQ - 2 Score 0 0 0  Cognitive Testing MMSE - Mini Mental State Exam 01/13/2016  Orientation to time 5  Orientation to Place 5  Registration 3  Attention/ Calculation 0  Recall 3  Language- name 2 objects 0  Language- repeat 1  Language- follow 3 step command 3  Language- read & follow direction 0  Write a sentence 0  Copy design 0  Total score 20   PLEASE NOTE: A Mini-Cog screen was completed. Maximum score is 20. A value of 0 denotes this part of Folstein MMSE was not completed or the patient failed this part of the Mini-Cog screening.   Mini-Cog Screening Orientation to Time - Max 5 pts Orientation to Place - Max 5  pts Registration - Max 3 pts Recall - Max 3 pts Language Repeat - Max 1 pts Language Follow 3 Step Command - Max 3 pts  Immunization History  Administered Date(s) Administered  . Influenza Whole 06/06/2010  . Influenza,inj,Quad PF,36+ Mos 06/28/2014  . Influenza-Unspecified 06/06/2013  . Pneumococcal Conjugate-13 01/06/2015  . Pneumococcal Polysaccharide-23 12/04/1998  . Td 12/04/1998   Screening Tests Health Maintenance  Topic Date Due  . TETANUS/TDAP  01/07/2017 (Originally 12/03/2008)  . ZOSTAVAX  09/06/2048 (Originally 05/01/1990)  . INFLUENZA VACCINE  04/06/2016  . DEXA SCAN  Completed  . PNA vac Low Risk Adult  Completed      Plan:     I have personally reviewed and addressed the Medicare Annual Wellness questionnaire and have noted the following in the patient's chart:  A. Medical and social history B. Use of alcohol, tobacco or illicit drugs  C. Current medications and supplements D. Functional ability and status E.  Nutritional status F.  Physical activity G. Advance directives H. List of other physicians I.  Hospitalizations, surgeries, and ER visits in previous 12 months J.  Vitals K. Screenings to include hearing, vision, cognitive, depression L. Referrals and appointments - none  In addition, I have reviewed and discussed with patient certain preventive protocols, quality metrics, and best practice recommendations. A written personalized care plan for preventive services as well as general preventive health recommendations were provided to patient.  See attached scanned questionnaire for additional information.   Signed,   Randa Evens, MHA, BS, LPN Health Advisor 01/13/2016

## 2016-07-12 ENCOUNTER — Other Ambulatory Visit: Payer: Self-pay | Admitting: Internal Medicine

## 2017-03-16 ENCOUNTER — Telehealth: Payer: Self-pay | Admitting: Podiatry

## 2017-03-16 NOTE — Telephone Encounter (Signed)
I have a question regarding the cutting of toenails. Please call me back at [phone].

## 2017-04-06 ENCOUNTER — Encounter: Payer: Self-pay | Admitting: Podiatry

## 2017-04-06 ENCOUNTER — Ambulatory Visit (INDEPENDENT_AMBULATORY_CARE_PROVIDER_SITE_OTHER): Payer: Medicare Other | Admitting: Podiatry

## 2017-04-06 DIAGNOSIS — M79676 Pain in unspecified toe(s): Secondary | ICD-10-CM | POA: Diagnosis not present

## 2017-04-06 DIAGNOSIS — B351 Tinea unguium: Secondary | ICD-10-CM | POA: Diagnosis not present

## 2017-04-06 NOTE — Progress Notes (Signed)
   Subjective:    Patient ID: Kylie Kim, female    DOB: 20-May-1928, 83 y.o.   MRN: [id number]  HPI: She presents today chief complaint painful elongated toenails.  Review of Systems  All other systems reviewed and are negative.      Objective:   Physical Exam: Pulses are palpable bilateral. Neurologic sensorium is intact the tendon reflexes are intact muscle strength is noted bilateral to be normal. Orthopedic evaluation and x-rays mild hammertoe deformity digit deformities present but asymmetric.  He changed today which demonstrate spicule dystrophic ligament, nails with sharp radial nail margins no erythema edema cellulitis drainage or odor.      Assessment & Plan:  Pain limb secondary to onychomycosis.  Plan: Debridement of toenails 1 through 5 bilateral.

## 2017-06-09 ENCOUNTER — Ambulatory Visit (INDEPENDENT_AMBULATORY_CARE_PROVIDER_SITE_OTHER): Payer: Medicare Other

## 2017-06-09 DIAGNOSIS — Z23 Encounter for immunization: Secondary | ICD-10-CM | POA: Diagnosis not present

## 2018-05-19 ENCOUNTER — Encounter: Payer: Self-pay | Admitting: Internal Medicine

## 2018-05-19 ENCOUNTER — Ambulatory Visit (INDEPENDENT_AMBULATORY_CARE_PROVIDER_SITE_OTHER): Payer: Medicare Other | Admitting: Internal Medicine

## 2018-05-19 VITALS — BP 140/82 | HR 72 | Temp 98.0°F | Ht 63.0 in | Wt 135.0 lb

## 2018-05-19 DIAGNOSIS — G459 Transient cerebral ischemic attack, unspecified: Secondary | ICD-10-CM

## 2018-05-19 DIAGNOSIS — Z0000 Encounter for general adult medical examination without abnormal findings: Secondary | ICD-10-CM

## 2018-05-19 DIAGNOSIS — Z853 Personal history of malignant neoplasm of breast: Secondary | ICD-10-CM | POA: Diagnosis not present

## 2018-05-19 DIAGNOSIS — G43109 Migraine with aura, not intractable, without status migrainosus: Secondary | ICD-10-CM | POA: Insufficient documentation

## 2018-05-19 DIAGNOSIS — G250 Essential tremor: Secondary | ICD-10-CM | POA: Diagnosis not present

## 2018-05-19 DIAGNOSIS — Z7189 Other specified counseling: Secondary | ICD-10-CM | POA: Diagnosis not present

## 2018-05-19 LAB — LIPID PANEL
CHOL/HDL RATIO: 4
Cholesterol: 170 mg/dL (ref 0–200)
HDL: 39.8 mg/dL (ref >39.00)
NonHDL: 130.41
TRIGLYCERIDES: 280 mg/dL — AB (ref 0.0–149.0)
VLDL: 56 mg/dL — AB (ref 0.0–40.0)

## 2018-05-19 LAB — COMPREHENSIVE METABOLIC PANEL
ALT: 11 U/L (ref 0–35)
AST: 17 U/L (ref 0–37)
Albumin: 4.1 g/dL (ref 3.5–5.2)
Alkaline Phosphatase: 81 U/L (ref 39–117)
BUN: 18 mg/dL (ref 6–23)
CHLORIDE: 105 meq/L (ref 96–112)
CO2: 29 meq/L (ref 19–32)
CREATININE: 0.98 mg/dL (ref 0.40–1.20)
Calcium: 9.5 mg/dL (ref 8.4–10.5)
GFR: 56.92 mL/min — ABNORMAL LOW (ref >60.00)
Glucose, Bld: 95 mg/dL (ref 70–99)
POTASSIUM: 4.5 meq/L (ref 3.5–5.1)
SODIUM: 142 meq/L (ref 135–145)
Total Bilirubin: 0.4 mg/dL (ref 0.2–1.2)
Total Protein: 6.7 g/dL (ref 6.0–8.3)

## 2018-05-19 LAB — CBC
HCT: 38.3 % (ref 36.0–46.0)
Hemoglobin: 13.1 g/dL (ref 12.0–15.0)
MCHC: 34.2 g/dL (ref 30.0–36.0)
MCV: 90.4 fl (ref 78.0–100.0)
PLATELETS: 167 10*3/uL (ref 150.0–400.0)
RBC: 4.24 Mil/uL (ref 3.87–5.11)
RDW: 14.2 % (ref 11.5–15.5)
WBC: 4.5 10*3/uL (ref 4.0–10.5)

## 2018-05-19 LAB — T4, FREE: Free T4: 0.68 ng/dL (ref 0.60–1.60)

## 2018-05-19 LAB — LDL CHOLESTEROL, DIRECT: LDL DIRECT: 115 mg/dL

## 2018-05-19 NOTE — Assessment & Plan Note (Signed)
Less noticeable and doesn't bother her

## 2018-05-19 NOTE — Assessment & Plan Note (Signed)
Done with follow up Off the evista now

## 2018-05-19 NOTE — Assessment & Plan Note (Signed)
2 brief spells many months ago Hard to tell if worth taking action Will start every other day aspirin 911 for persistent neuro symptoms If another one, will check carotid tests

## 2018-05-19 NOTE — Progress Notes (Signed)
Subjective:    Patient ID: Kylie Kim, female    DOB: [birth year], 84 y.o.   MRN: [id number]  HPI Here for Medicare wellness visit and follow up of chronic health conditions Reviewed form and advanced directives Reviewed other doctors Stays active but no set exercise Will need cataract surgery due to worsening vision Not aware of any hearing issues Occasional drink of alcohol No tobacco No falls No depression but occasionally awakens "sad"---gone quickly (when getting out to church) Independent with instrumental ADLs Some memory issues---not really worrisome  Has had 2 "mini-strokes" in past year Had some expressive aphasia---took 2 baby aspirin and it resolved Next time-- had double vision --and was gone within 10 minutes (took aspirin again) These were separated by a long time Most recent one was 4-5 months ago Didn't call me due to the brief nature (even though husband had CEA this year)  No chest pain No SOB No dizziness or syncope  Has been off evista for years No breast masses or problems  Doesn't notice her tremor much Not a problem for her  Current Outpatient Medications on File Prior to Visit  Medication Sig Dispense Refill  . Calcium Citrate-Vitamin D (CITRUS CALCIUM 1500 + D PO) Take 1 tablet by mouth daily.    . Multiple Vitamins-Minerals (ABC PLUS SENIOR) TABS Take 1 tablet by mouth daily.    . polyethylene glycol powder (GLYCOLAX/MIRALAX) powder Take 17 g by mouth every other day.     No current facility-administered medications on file prior to visit.     Allergies  Allergen Reactions  . Alendronate Sodium     REACTION: unspecified  . Oxycodone-Acetaminophen     REACTION: unspecified    Past Medical History:  Diagnosis Date  . Essential and other specified forms of tremor   . History of breast cancer 07/96  . Hyperlipidemia   . Osteoporosis   . Post herpetic neuralgia 01/07  . Raynaud's disease     Past Surgical History:    Procedure Laterality Date  . ABDOMINAL HYSTERECTOMY    . BREAST LUMPECTOMY  07/96   surgery at [facility]    History reviewed. No pertinent family history.  Social History   Socioeconomic History  . Marital status: Married    Spouse name: Not on file  . Number of children: 2  . Years of education: Not on file  . Highest education level: Not on file  Occupational History  . Occupation: retired [employer]  Social Needs  . Financial resource strain: Not on file  . Food insecurity:    Worry: Not on file    Inability: Not on file  . Transportation needs:    Medical: Not on file    Non-medical: Not on file  Tobacco Use  . Smoking status: Never Smoker  . Smokeless tobacco: Never Used  Substance and Sexual Activity  . Alcohol use: Yes  . Drug use: No  . Sexual activity: Never  Lifestyle  . Physical activity:    Days per week: Not on file    Minutes per session: Not on file  . Stress: Not on file  Relationships  . Social connections:    Talks on phone: Not on file    Gets together: Not on file    Attends religious service: Not on file    Active member of club or organization: Not on file    Attends meetings of clubs or organizations: Not on file    Relationship status: Not on file  .  Intimate partner violence:    Fear of current or ex partner: Not on file    Emotionally abused: Not on file    Physically abused: Not on file    Forced sexual activity: Not on file  Other Topics Concern  . Not on file  Social History Narrative   Has living will   Would want husband, then son Tacey HeapScottie, should make health care decisions. (not positive she has health care POA)   Would accept resuscitation attempts but no prolonged ventilation   Does not want tube feeds if cognitively unaware      Review of Systems Appetite is fine Weight stable Sleeps well Has urinary frequency--despite not drinking a lot. No incontinence or dysuria Bowels are fine. No blood. Takes miralax Wears seat  belt Teeth are fine---keeps up with dentist and periodontist Sees derm yearly Facial pain is gone No heartburn or dysphagia    Objective:   Physical Exam  Constitutional: She is oriented to person, place, and time. She appears well-developed. No distress.  HENT:  Mouth/Throat: Oropharynx is clear and moist. No oropharyngeal exudate.  Neck: No thyromegaly present.  No carotid bruits  Cardiovascular: Normal rate, regular rhythm and normal heart sounds. Exam reveals no gallop.  No murmur heard. Faint pedal pulses  Respiratory: Effort normal and breath sounds normal. No respiratory distress. She has no wheezes. She has no rales.  GI: Soft. There is no tenderness.  Musculoskeletal: She exhibits no edema or tenderness.  Lymphadenopathy:    She has no cervical adenopathy.  Neurological: She is alert and oriented to person, place, and time.  President--- "Garnet Koyanagionald Trump, Obama, Clinton---no, Bush" [id number] Recall 3/3  Mild head shake  Skin: No rash noted. No erythema.  Psychiatric: She has a normal mood and affect. Her behavior is normal.           Assessment & Plan:

## 2018-05-19 NOTE — Assessment & Plan Note (Signed)
See social history 

## 2018-05-19 NOTE — Progress Notes (Signed)
Hearing Screening (Inadequate exam)   Method: Audiometry   125Hz  250Hz  500Hz  1000Hz  2000Hz  3000Hz  4000Hz  6000Hz  8000Hz   Right ear:   0 0 40  0    Left ear:   40 0 40  0      Visual Acuity Screening   Right eye Left eye Both eyes  Without correction:     With correction: 20/50 20/30 20/30

## 2018-05-19 NOTE — Assessment & Plan Note (Signed)
I have personally reviewed the Medicare Annual Wellness questionnaire and have noted 1. The patient's medical and social history 2. Their use of alcohol, tobacco or illicit drugs 3. Their current medications and supplements 4. The patient's functional ability including ADL's, fall risks, home safety risks and hearing or visual             impairment. 5. Diet and physical activities 6. Evidence for depression or mood disorders  The patients weight, height, BMI and visual acuity have been recorded in the chart I have made referrals, counseling and provided education to the patient based review of the above and I have provided the pt with a written personalized care plan for preventive services.  I have provided you with a copy of your personalized plan for preventive services. Please take the time to review along with your updated medication list.  No cancer screening due to age Discussed strengthening Pneumovax booster and flu vaccine when she returns from upcoming vacation

## 2018-06-01 ENCOUNTER — Ambulatory Visit (INDEPENDENT_AMBULATORY_CARE_PROVIDER_SITE_OTHER): Payer: Medicare Other

## 2018-06-01 DIAGNOSIS — Z23 Encounter for immunization: Secondary | ICD-10-CM

## 2019-02-15 ENCOUNTER — Encounter: Payer: Self-pay | Admitting: Podiatry

## 2019-02-15 ENCOUNTER — Other Ambulatory Visit: Payer: Self-pay

## 2019-02-15 ENCOUNTER — Ambulatory Visit (INDEPENDENT_AMBULATORY_CARE_PROVIDER_SITE_OTHER): Payer: Medicare Other | Admitting: Podiatry

## 2019-02-15 DIAGNOSIS — M79675 Pain in left toe(s): Secondary | ICD-10-CM | POA: Diagnosis not present

## 2019-02-15 DIAGNOSIS — B351 Tinea unguium: Secondary | ICD-10-CM | POA: Diagnosis not present

## 2019-02-15 DIAGNOSIS — M79674 Pain in right toe(s): Secondary | ICD-10-CM | POA: Diagnosis not present

## 2019-02-15 NOTE — Progress Notes (Signed)
Complaint:  Visit Type: Patient presents  to my office for continued preventative foot care services. Complaint: Patient states" my nails have grown long and thick and become painful to walk and wear shoes"  The patient presents for preventative foot care services.   Podiatric Exam: Vascular: dorsalis pedis and posterior tibial pulses are palpable bilateral. Capillary return is immediate. Temperature gradient is WNL. Skin turgor WNL  Sensorium: Normal Semmes Weinstein monofilament test. Normal tactile sensation bilaterally. Nail Exam: Pt has thick disfigured discolored nails with subungual debris noted bilateral entire nail hallux through fifth toenails Ulcer Exam: There is no evidence of ulcer or pre-ulcerative changes or infection. Orthopedic Exam: Muscle tone and strength are WNL. No limitations in general ROM. No crepitus or effusions noted. Foot type and digits show no abnormalities. Bony prominences are unremarkable. Skin: No Porokeratosis. No infection or ulcers  Diagnosis:  Onychomycosis, , Pain in right toe, pain in left toes  Treatment & Plan Procedures and Treatment: Consent by patient was obtained for treatment procedures.   Debridement of mycotic and hypertrophic toenails, 1 through 5 bilateral and clearing of subungual debris. No ulceration, no infection noted.  Return Visit-Office Procedure: Patient instructed to return to the office for a follow up visit 3 months for continued evaluation and treatment.    Gardiner Barefoot DPM

## 2019-02-20 ENCOUNTER — Encounter: Payer: Self-pay | Admitting: Anesthesiology

## 2019-02-20 ENCOUNTER — Encounter: Payer: Self-pay | Admitting: *Deleted

## 2019-02-20 ENCOUNTER — Other Ambulatory Visit: Payer: Self-pay

## 2019-02-22 NOTE — Discharge Instructions (Signed)

## 2019-02-23 ENCOUNTER — Other Ambulatory Visit: Payer: Self-pay

## 2019-02-23 ENCOUNTER — Other Ambulatory Visit
Admission: RE | Admit: 2019-02-23 | Discharge: 2019-02-23 | Disposition: A | Discharge status: Home / Self Care | Payer: Medicare Other | Source: Ambulatory Visit | Attending: Ophthalmology | Admitting: Ophthalmology

## 2019-02-23 DIAGNOSIS — Z1159 Encounter for screening for other viral diseases: Secondary | ICD-10-CM | POA: Diagnosis present

## 2019-02-24 LAB — NOVEL CORONAVIRUS, NAA (HOSP ORDER, SEND-OUT TO REF LAB; TAT 18-24 HRS): SARS-CoV-2, NAA: NOT DETECTED

## 2019-02-27 ENCOUNTER — Encounter: Payer: Self-pay | Admitting: Family Medicine

## 2019-02-27 ENCOUNTER — Ambulatory Visit
Admission: RE | Admit: 2019-02-27 | Discharge: 2019-02-27 | Disposition: A | Discharge status: Home / Self Care | Payer: Medicare Other | Attending: Ophthalmology | Admitting: Ophthalmology

## 2019-02-27 ENCOUNTER — Ambulatory Visit (INDEPENDENT_AMBULATORY_CARE_PROVIDER_SITE_OTHER): Payer: Medicare Other | Admitting: Family Medicine

## 2019-02-27 ENCOUNTER — Other Ambulatory Visit: Payer: Self-pay

## 2019-02-27 ENCOUNTER — Telehealth: Payer: Self-pay

## 2019-02-27 ENCOUNTER — Encounter
Admission: RE | Disposition: A | Discharge status: Home / Self Care | Payer: Self-pay | Source: Home / Self Care | Attending: Ophthalmology

## 2019-02-27 VITALS — BP 180/100 | HR 75 | Temp 97.6°F | Ht 63.0 in | Wt 131.4 lb

## 2019-02-27 DIAGNOSIS — I160 Hypertensive urgency: Secondary | ICD-10-CM | POA: Diagnosis not present

## 2019-02-27 DIAGNOSIS — I10 Essential (primary) hypertension: Secondary | ICD-10-CM | POA: Insufficient documentation

## 2019-02-27 DIAGNOSIS — H269 Unspecified cataract: Secondary | ICD-10-CM | POA: Diagnosis not present

## 2019-02-27 DIAGNOSIS — Z5309 Procedure and treatment not carried out because of other contraindication: Secondary | ICD-10-CM | POA: Insufficient documentation

## 2019-02-27 HISTORY — DX: Transient cerebral ischemic attack, unspecified: G45.9

## 2019-02-27 HISTORY — DX: Other amnesia: R41.3

## 2019-02-27 LAB — CBC WITH DIFFERENTIAL/PLATELET
Basophils Absolute: 0 10*3/uL (ref 0.0–0.1)
Basophils Relative: 0.3 % (ref 0.0–3.0)
Eosinophils Absolute: 0 10*3/uL (ref 0.0–0.7)
Eosinophils Relative: 0.9 % (ref 0.0–5.0)
HCT: 40.3 % (ref 36.0–46.0)
Hemoglobin: 13.4 g/dL (ref 12.0–15.0)
Lymphocytes Relative: 12.4 % (ref 12.0–46.0)
Lymphs Abs: 0.7 10*3/uL (ref 0.7–4.0)
MCHC: 33.1 g/dL (ref 30.0–36.0)
MCV: 92.9 fl (ref 78.0–100.0)
Monocytes Absolute: 0.4 10*3/uL (ref 0.1–1.0)
Monocytes Relative: 7 % (ref 3.0–12.0)
Neutro Abs: 4.2 10*3/uL (ref 1.4–7.7)
Neutrophils Relative %: 79.4 % — ABNORMAL HIGH (ref 43.0–77.0)
Platelets: 158 10*3/uL (ref 150.0–400.0)
RBC: 4.34 Mil/uL (ref 3.87–5.11)
RDW: 13.7 % (ref 11.5–15.5)
WBC: 5.3 10*3/uL (ref 4.0–10.5)

## 2019-02-27 LAB — BASIC METABOLIC PANEL
BUN: 17 mg/dL (ref 6–23)
CO2: 30 mEq/L (ref 19–32)
Calcium: 10 mg/dL (ref 8.4–10.5)
Chloride: 101 mEq/L (ref 96–112)
Creatinine, Ser: 0.74 mg/dL (ref 0.40–1.20)
GFR: 73.93 mL/min (ref >60.00)
Glucose, Bld: 81 mg/dL (ref 70–99)
Potassium: 4.3 mEq/L (ref 3.5–5.1)
Sodium: 137 mEq/L (ref 135–145)

## 2019-02-27 LAB — TSH: TSH: 2.66 u[IU]/mL (ref 0.35–4.50)

## 2019-02-27 LAB — LDL CHOLESTEROL, DIRECT: Direct LDL: 149 mg/dL

## 2019-02-27 SURGERY — PHACOEMULSIFICATION, CATARACT, WITH IOL INSERTION
Anesthesia: Monitor Anesthesia Care | Laterality: Right

## 2019-02-27 MED ORDER — AMLODIPINE BESYLATE 2.5 MG PO TABS: 2.5000 mg | ORAL_TABLET | Freq: Every day | ORAL | 3 refills | Status: DC

## 2019-02-27 MED ORDER — ARMC OPHTHALMIC DILATING DROPS: 1.0000 "application " | OPHTHALMIC | Status: DC | PRN

## 2019-02-27 MED ORDER — TETRACAINE HCL 0.5 % OP SOLN: 1.0000 [drp] | OPHTHALMIC | Status: DC | PRN

## 2019-02-27 SURGICAL SUPPLY — 18 items
CANNULA ANT/CHMB 27GA (MISCELLANEOUS) ×3 IMPLANT
GLOVE SURG LX 8.0 MICRO (GLOVE) ×4
GLOVE SURG LX STRL 8.0 MICRO (GLOVE) ×2 IMPLANT
GLOVE SURG TRIUMPH 8.0 PF LTX (GLOVE) ×3 IMPLANT
GOWN STRL REUS W/ TWL LRG LVL3 (GOWN DISPOSABLE) ×2 IMPLANT
GOWN STRL REUS W/TWL LRG LVL3 (GOWN DISPOSABLE) ×6
MARKER SKIN DUAL TIP RULER LAB (MISCELLANEOUS) ×3 IMPLANT
NDL RETROBULBAR .5 NSTRL (NEEDLE) ×3 IMPLANT
NEEDLE FILTER BLUNT 18X 1/2SAF (NEEDLE) ×2
NEEDLE FILTER BLUNT 18X1 1/2 (NEEDLE) ×1 IMPLANT
PACK EYE AFTER SURG (MISCELLANEOUS) ×3 IMPLANT
PACK OPTHALMIC (MISCELLANEOUS) ×3 IMPLANT
PACK PORFILIO (MISCELLANEOUS) ×3 IMPLANT
SUT ETHILON 10-0 CS-B-6CS-B-6 (SUTURE)
SUTURE EHLN 10-0 CS-B-6CS-B-6 (SUTURE) IMPLANT
SYR 3ML LL SCALE MARK (SYRINGE) ×3 IMPLANT
SYR TB 1ML LUER SLIP (SYRINGE) ×3 IMPLANT
WIPE NON LINTING 3.25X3.25 (MISCELLANEOUS) ×3 IMPLANT

## 2019-02-27 NOTE — Patient Instructions (Addendum)
EKG today. Labs today.  Blood pressures are slowly coming down.  I think elevation largely due to stress around recent cataract surgery procedure.  Let's postpone for now while we monitor blood pressures more closely. Your goal blood pressure is <140/90. Work on low salt/sodium diet - goal <1.5gm (1,500mg ) per day. Eat a diet high in fruits/vegetables and whole grains.  Look into mediterranean and DASH diet. Look at [email or web address] for more resources  Return in 2-3 weeks for follow up with Dr Silvio Pate for repeat BP check and then decide at that time on medication.

## 2019-02-27 NOTE — Progress Notes (Signed)
Canceled per anesthesia due to high BP

## 2019-02-27 NOTE — Telephone Encounter (Signed)
Seen today. 

## 2019-02-27 NOTE — Telephone Encounter (Signed)
Pt was to have cataract surgery today but was delayed due to several readings pre op of BP being 220/100. Pt was advised to see PCP and get treatment for hypertension. I spoke with pt; pt in no pain; pt was nervous about preparation for eye surgery. No covid symptoms,no travel and no known exposure to covid. 4 days ago pt had covid testing prior to surgery and results were neg.No CP,dizziness or H/A. Dr Silvio Pate had no available appts and pt scheduled in office appt with Dr Darnell Level today at 11:30. Pt does not have way to ck BP at home. ED precautions given and pt voiced understanding.

## 2019-02-27 NOTE — Assessment & Plan Note (Addendum)
Anticipate brought on by recent high anxiety surrounding cataract surgery. BP did come down some on its own while in the office today however remaining persistently elevated.  Did suggest start amlodipine 2.5mg  daily - she will fill and take first dose right away. Discussed monitoring for ankle swelling. She does endorse h/o TIA.  Advised to start monitoring BP at local pharmacy and f/u with PCP in 2 weeks with BP log at that time. Red flags to seek urgent care for hypertensive emergency reviewed.  Check labs today.

## 2019-02-27 NOTE — Progress Notes (Signed)
This visit was conducted in person.  BP (!) 180/100 (BP Location: Right Arm, Cuff Size: Normal) Comment: checked prior to discharge  Pulse 75   Temp 97.6 F (36.4 C) (Temporal)   Ht 5\' 3"  (1.6 m)   Wt 131 lb 6 oz (59.6 kg)   SpO2 97%   BMI 23.27 kg/m   BP Readings from Last 3 Encounters:  02/27/19 (!) 180/100  05/19/18 140/82  01/13/16 132/90    CC: elevated BP Subjective:    Patient ID: Kylie Kim, female    DOB: 20-Oct-1927, 85 y.o.   MRN: [id number]  HPI: Kylie Kim is a 85 y.o. female presenting on 02/27/2019 for Elevated BP (Pt was scheduled for cataract surgery today but was delayed due to elevated BP with 3 readings, in 200s/100s.  Surgery has been postponed until BP issue is resolved.  Pt states she has never had BP issues before and thinks she was so nervous to have the procedure. )   Planning to have R cataract surgery today - BP elevated to 200s systolic so surgery was cancelled and she was sent home. She does endorse feeling very anxious prior to recent cataract surgery. She was a little bothered by how involved the cataract surgery turned out to be.   Never history of hypertension. Mother did have HTN.  Denies increased salt recently, but she does enjoy her salt. Does consume canned soups etc.   H/o TIAs in the past, latest several months ago per patient.   Denies HA, vision changes, CP/tightness, SOB, leg swelling.  No unilateral weakness, slurred speech, paresthesias.  Feels a bit "foggy headed". Husband drove her here today.      Relevant past medical, surgical, family and social history reviewed and updated as indicated. Interim medical history since our last visit reviewed. Allergies and medications reviewed and updated. Outpatient Medications Prior to Visit  Medication Sig Dispense Refill  . Calcium Citrate-Vitamin D (CITRUS CALCIUM 1500 + D PO) Take 1 tablet by mouth daily.    . Multiple Vitamin (MULTIVITAMIN) tablet Take 1 tablet  by mouth daily.    . Multiple Vitamins-Minerals (ABC PLUS SENIOR) TABS Take 1 tablet by mouth daily.    . polyethylene glycol powder (GLYCOLAX/MIRALAX) powder Take 17 g by mouth every other day.     No facility-administered medications prior to visit.      Per HPI unless specifically indicated in ROS section below Review of Systems Objective:    BP (!) 180/100 (BP Location: Right Arm, Cuff Size: Normal) Comment: checked prior to discharge  Pulse 75   Temp 97.6 F (36.4 C) (Temporal)   Ht 5\' 3"  (1.6 m)   Wt 131 lb 6 oz (59.6 kg)   SpO2 97%   BMI 23.27 kg/m   Wt Readings from Last 3 Encounters:  02/27/19 131 lb 6 oz (59.6 kg)  02/20/19 125 lb (56.7 kg)  05/19/18 135 lb (61.2 kg)    Physical Exam Vitals signs and nursing note reviewed.  Constitutional:      Appearance: Normal appearance. She is not ill-appearing.  HENT:     Mouth/Throat:     Mouth: Mucous membranes are moist.     Pharynx: No posterior oropharyngeal erythema.  Eyes:     Extraocular Movements: Extraocular movements intact.     Pupils: Pupils are equal, round, and reactive to light.  Neck:     Musculoskeletal: Normal range of motion and neck supple.     Vascular: No carotid bruit.  Cardiovascular:     Rate and Rhythm: Normal rate and regular rhythm.     Pulses: Normal pulses.     Heart sounds: Normal heart sounds. No murmur.  Pulmonary:     Effort: Pulmonary effort is normal. No respiratory distress.     Breath sounds: Normal breath sounds. No wheezing, rhonchi or rales.  Musculoskeletal:     Right lower leg: No edema.     Left lower leg: No edema.  Lymphadenopathy:     Cervical: No cervical adenopathy.  Skin:    General: Skin is warm and dry.     Findings: No erythema or rash.  Neurological:     General: No focal deficit present.     Mental Status: She is alert.  Psychiatric:        Mood and Affect: Mood normal.        Behavior: Behavior normal.       Results for orders placed or performed  during the hospital encounter of 02/23/19  Novel Coronavirus, NAA (hospital order; send-out to ref lab)   Specimen: Nasopharyngeal Swab; Respiratory  Result Value Ref Range   SARS-CoV-2, NAA NOT DETECTED NOT DETECTED   Coronavirus Source NASOPHARYNGEAL    EKG - NSR rate 60s, slight LAD, short PR, T wave inversions inferiorly with poor R wave progression present on EKG 2011  Assessment & Plan:   Problem List Items Addressed This Visit    Hypertensive urgency - Primary    Anticipate brought on by recent high anxiety surrounding cataract surgery. BP did come down some on its own while in the office today however remaining persistently elevated.  Did suggest start amlodipine 2.5mg  daily - she will fill and take first dose right away. Discussed monitoring for ankle swelling. She does endorse h/o TIA.  Advised to start monitoring BP at local pharmacy and f/u with PCP in 2 weeks with BP log at that time. Red flags to seek urgent care for hypertensive emergency reviewed.  Check labs today.       Relevant Medications   amLODipine (NORVASC) 2.5 MG tablet   Other Relevant Orders   Basic metabolic panel   CBC with Differential/Platelet   TSH   LDL Cholesterol, Direct   EKG 12-Lead (Completed)       Meds ordered this encounter  Medications  . amLODipine (NORVASC) 2.5 MG tablet    Sig: Take 1 tablet (2.5 mg total) by mouth daily.    Dispense:  30 tablet    Refill:  3   Orders Placed This Encounter  Procedures  . Basic metabolic panel  . CBC with Differential/Platelet  . TSH  . LDL Cholesterol, Direct  . EKG 12-Lead    Follow up plan: Return in about 2 weeks (around 03/13/2019) for follow up visit.  Ria Bush, MD

## 2019-02-27 NOTE — Anesthesia Preprocedure Evaluation (Addendum)
Anesthesia Evaluation  Patient identified by MRN, date of birth, ID band Patient awake    Reviewed: Allergy & Precautions, NPO status , Patient's Chart, lab work & pertinent test results  Airway Mallampati: II  TM Distance: >3 FB     Dental   Pulmonary    breath sounds clear to auscultation       Cardiovascular  Rhythm:Regular Rate:Normal  Raynaud's HLD   Neuro/Psych Post herpetic neuralgia TIA   GI/Hepatic negative GI ROS,   Endo/Other    Renal/GU      Musculoskeletal Osteoporosis   Abdominal   Peds  Hematology   Anesthesia Other Findings   Reproductive/Obstetrics                             Anesthesia Physical Anesthesia Plan  ASA: II  Anesthesia Plan: MAC   Post-op Pain Management:    Induction:   PONV Risk Score and Plan: Midazolam  Airway Management Planned: Nasal Cannula and Natural Airway  Additional Equipment:   Intra-op Plan:   Post-operative Plan:   Informed Consent: I have reviewed the patients History and Physical, chart, labs and discussed the procedure including the risks, benefits and alternatives for the proposed anesthesia with the patient or authorized representative who has indicated his/her understanding and acceptance.       Plan Discussed with: CRNA  Anesthesia Plan Comments: (Preop BP 220/100 on several readings. Plan to send to PCP for evaluation and treatment of HTN.)       Anesthesia Quick Evaluation

## 2019-02-28 NOTE — H&P (Signed)
Patients operation was cancelled due to uncontrolled hypertension. Advised to see PCP ASAP.

## 2019-03-06 ENCOUNTER — Other Ambulatory Visit: Payer: Self-pay

## 2019-03-06 ENCOUNTER — Ambulatory Visit: Payer: Medicare Other

## 2019-03-06 VITALS — BP 160/80 | HR 76

## 2019-03-06 DIAGNOSIS — I160 Hypertensive urgency: Secondary | ICD-10-CM

## 2019-03-06 NOTE — Progress Notes (Signed)
Per Dr. Danise Mina in lab result note, 02/27/19, pt in today for NV BP check since starting amlodipine. BP is 160/82 in left arm then 160/80 in right arm and documented in 'Vital Signs'.

## 2019-03-06 NOTE — Addendum Note (Signed)
Addended by: Ria Bush on: 03/06/2019 01:57 PM   Modules accepted: Orders

## 2019-03-06 NOTE — Progress Notes (Signed)
plz notify pt - BP improved but still remaining high - let's increase amlodipine to 5mg  daily - may take 2 tablets at once until PCP appt next week.

## 2019-03-14 ENCOUNTER — Other Ambulatory Visit: Payer: Self-pay

## 2019-03-14 ENCOUNTER — Encounter: Payer: Self-pay | Admitting: Internal Medicine

## 2019-03-14 ENCOUNTER — Ambulatory Visit (INDEPENDENT_AMBULATORY_CARE_PROVIDER_SITE_OTHER): Payer: Medicare Other | Admitting: Internal Medicine

## 2019-03-14 DIAGNOSIS — I10 Essential (primary) hypertension: Secondary | ICD-10-CM | POA: Diagnosis not present

## 2019-03-14 MED ORDER — AMLODIPINE BESYLATE 5 MG PO TABS: 5.0000 mg | ORAL_TABLET | Freq: Every day | ORAL | 3 refills | Status: DC

## 2019-03-14 NOTE — Progress Notes (Signed)
Subjective:    Patient ID: Kylie Kim, female    DOB: August 29, 1928, 85 y.o.   MRN: [id number]  HPI Here for follow up about her blood pressure elevation  Reviewed the markedly elevated BP before planned cataract surgery Does feel she was nervous--but not overly Had no symptoms --chest pain, SOB, palpitations, etc Stays active and does exercise bike No change in exercise tolerance  Did increase the amlodipine to 5mg   Taking at night now  Will feel a bit lightheaded in the morning--but not all the time Very mild but may persist Doesn't keep her from doing things After up and moving No syncope No edema  Current Outpatient Medications on File Prior to Visit  Medication Sig Dispense Refill  . amLODipine (NORVASC) 2.5 MG tablet Take 2 tablets (5 mg total) by mouth daily.    . Calcium Citrate-Vitamin D (CITRUS CALCIUM 1500 + D PO) Take 1 tablet by mouth daily.    . Multiple Vitamin (MULTIVITAMIN) tablet Take 1 tablet by mouth daily.    . Multiple Vitamins-Minerals (ABC PLUS SENIOR) TABS Take 1 tablet by mouth daily.    . polyethylene glycol powder (GLYCOLAX/MIRALAX) powder Take 17 g by mouth every other day.     No current facility-administered medications on file prior to visit.     Allergies  Allergen Reactions  . Alendronate Sodium     REACTION: unspecified  . Oxycodone-Acetaminophen     REACTION: unspecified    Past Medical History:  Diagnosis Date  . Essential and other specified forms of tremor   . History of breast cancer 07/96  . Hyperlipidemia   . Osteoporosis   . Poor memory   . Post herpetic neuralgia 01/07  . Raynaud's disease   . TIA (transient ischemic attack)    "several".  No deficits    Past Surgical History:  Procedure Laterality Date  . ABDOMINAL HYSTERECTOMY    . BREAST LUMPECTOMY  07/96   [facility]    History reviewed. No pertinent family history.  Social History   Socioeconomic History  . Marital status: Married   Spouse name: Not on file  . Number of children: 2  . Years of education: Not on file  . Highest education level: Not on file  Occupational History  . Occupation: retired [employer]  Social Needs  . Financial resource strain: Not on file  . Food insecurity    Worry: Not on file    Inability: Not on file  . Transportation needs    Medical: Not on file    Non-medical: Not on file  Tobacco Use  . Smoking status: Never Smoker  . Smokeless tobacco: Never Used  Substance and Sexual Activity  . Alcohol use: Yes    Comment: may have drink 1x/month  . Drug use: No  . Sexual activity: Never  Lifestyle  . Physical activity    Days per week: Not on file    Minutes per session: Not on file  . Stress: Not on file  Relationships  . Social Musicianconnections    Talks on phone: Not on file    Gets together: Not on file    Attends religious service: Not on file    Active member of club or organization: Not on file    Attends meetings of clubs or organizations: Not on file    Relationship status: Not on file  . Intimate partner violence    Fear of current or ex partner: Not on file  Emotionally abused: Not on file    Physically abused: Not on file    Forced sexual activity: Not on file  Other Topics Concern  . Not on file  Social History Narrative   Has living will   Would want husband, then son [name], should make health care decisions. (not positive she has health care POA)   Would accept resuscitation attempts but no prolonged ventilation   Does not want tube feeds if cognitively unaware      Review of Systems Has been better about keeping up with fluids Vision is good---not really aware of significant vision problems in right (but doesn't drive at night) No headaches Has been taking aspirin ---asked her to stop    Objective:   Physical Exam  Constitutional: She appears well-developed. No distress.  Neck: No thyromegaly present.  Cardiovascular: Normal rate, regular rhythm and normal  heart sounds. Exam reveals no gallop.  No murmur heard. Respiratory: Effort normal and breath sounds normal. No respiratory distress. She has no wheezes. She has no rales.  Musculoskeletal:        General: No edema.  Lymphadenopathy:    She has no cervical adenopathy.  Psychiatric: She has a normal mood and affect. Her behavior is normal.           Assessment & Plan:

## 2019-03-14 NOTE — Assessment & Plan Note (Signed)
BP Readings from Last 3 Encounters:  03/14/19 140/80  03/06/19 (!) 160/80  02/27/19 (!) 180/100   Repeat 140/88 on right Discussed the lightheadedness---not clearly from the medication but possible Will continue the 5mg  dose

## 2019-05-23 ENCOUNTER — Ambulatory Visit (INDEPENDENT_AMBULATORY_CARE_PROVIDER_SITE_OTHER): Payer: Medicare Other | Admitting: Internal Medicine

## 2019-05-23 ENCOUNTER — Other Ambulatory Visit: Payer: Self-pay

## 2019-05-23 ENCOUNTER — Encounter: Payer: Self-pay | Admitting: Internal Medicine

## 2019-05-23 VITALS — BP 128/84 | HR 77 | Temp 98.4°F | Ht 62.75 in | Wt 130.0 lb

## 2019-05-23 DIAGNOSIS — Z0000 Encounter for general adult medical examination without abnormal findings: Secondary | ICD-10-CM

## 2019-05-23 DIAGNOSIS — L918 Other hypertrophic disorders of the skin: Secondary | ICD-10-CM | POA: Diagnosis not present

## 2019-05-23 DIAGNOSIS — Z7189 Other specified counseling: Secondary | ICD-10-CM

## 2019-05-23 DIAGNOSIS — Z23 Encounter for immunization: Secondary | ICD-10-CM

## 2019-05-23 DIAGNOSIS — G250 Essential tremor: Secondary | ICD-10-CM | POA: Diagnosis not present

## 2019-05-23 DIAGNOSIS — I10 Essential (primary) hypertension: Secondary | ICD-10-CM

## 2019-05-23 DIAGNOSIS — K5909 Other constipation: Secondary | ICD-10-CM

## 2019-05-23 DIAGNOSIS — G459 Transient cerebral ischemic attack, unspecified: Secondary | ICD-10-CM

## 2019-05-23 NOTE — Progress Notes (Signed)
Subjective:    Patient ID: Kylie Kim, female    DOB: 08/03/1928, 85 y.o.   MRN: [id number]  HPI Here for Medicare wellness visit and follow up of chronic health conditions Reviewed form and advanced directives Reviewed other doctors Occasional glass of wine No tobacco Tries to ride stationary bike regularly Vision is still not great---hasn't rescheduled cataract surgery Hearing is okay as far as she can tell No falls No depression or anhedonia--some issues with COVID restrictions, etc Independent with instrumental ADLs---cleaning women every few weeks Feels her short term memory is not good  "I am feeling my age" Having bowel issues---uses miralax every night, and often does fine. But then about once a month will get blocked up with epigastric pain. Will lie on heating pad and the pain improves. Does get N/V with the pain Completely normal BM today--now feels great Usually doesn't completely flush out like today  Has some episodes she thinks of mini strokes Some recall problems---will be unable to remember something, and then it comes to her hours later Will have a visual aura---sees streaks across both eyes Takes an aspirin--and then it is gone Discussed ---sounds like migraine aura  No problems with BP med now No dizziness or syncope No chest pain No edema No palpitations No headaches  Current Outpatient Medications on File Prior to Visit  Medication Sig Dispense Refill  . amLODipine (NORVASC) 5 MG tablet Take 1 tablet (5 mg total) by mouth daily. 90 tablet 3  . Calcium Citrate-Vitamin D (CITRUS CALCIUM 1500 + D PO) Take 1 tablet by mouth daily.    . Multiple Vitamin (MULTIVITAMIN) tablet Take 1 tablet by mouth daily.    . Multiple Vitamins-Minerals (ABC PLUS SENIOR) TABS Take 1 tablet by mouth daily.    . polyethylene glycol powder (GLYCOLAX/MIRALAX) powder Take 17 g by mouth every other day.     No current facility-administered medications on file prior  to visit.     Allergies  Allergen Reactions  . Alendronate Sodium     REACTION: unspecified  . Oxycodone-Acetaminophen     REACTION: unspecified    Past Medical History:  Diagnosis Date  . Essential and other specified forms of tremor   . History of breast cancer 07/96  . Hyperlipidemia   . Osteoporosis   . Poor memory   . Post herpetic neuralgia 01/07  . Raynaud's disease   . TIA (transient ischemic attack)    "several".  No deficits    Past Surgical History:  Procedure Laterality Date  . ABDOMINAL HYSTERECTOMY    . BREAST LUMPECTOMY  07/96   surgery at [facility]    History reviewed. No pertinent family history.  Social History   Socioeconomic History  . Marital status: Married    Spouse name: Not on file  . Number of children: 2  . Years of education: Not on file  . Highest education level: Not on file  Occupational History  . Occupation: retired [employer]  Social Needs  . Financial resource strain: Not on file  . Food insecurity    Worry: Not on file    Inability: Not on file  . Transportation needs    Medical: Not on file    Non-medical: Not on file  Tobacco Use  . Smoking status: Never Smoker  . Smokeless tobacco: Never Used  Substance and Sexual Activity  . Alcohol use: Yes    Comment: may have drink 1x/month  . Drug use: No  . Sexual activity: Never  Lifestyle  . Physical activity    Days per week: Not on file    Minutes per session: Not on file  . Stress: Not on file  Relationships  . Social Musicianconnections    Talks on phone: Not on file    Gets together: Not on file    Attends religious service: Not on file    Active member of club or organization: Not on file    Attends meetings of clubs or organizations: Not on file    Relationship status: Not on file  . Intimate partner violence    Fear of current or ex partner: Not on file    Emotionally abused: Not on file    Physically abused: Not on file    Forced sexual activity: Not on file  Other  Topics Concern  . Not on file  Social History Narrative   Has living will   Would want husband, then son Kylie Kim, should make health care decisions. (not positive she has health care POA)   Would accept resuscitation attempts but no prolonged ventilation   Does not want tube feeds if cognitively unaware      Review of Systems Appetite is fine except for her spells Weight is stable Had some heaviness in legs walking in parking lot recently--seems to have lost strength No sig back or joint pains Sleeps well  Teeth okay---keeps up with exams Wears seat belt Voids fine---does have urgency. Wears a pad just in case--but no regular incontinence No rash or suspicious lesions Has skin tag over left eye---really bothers her No heartburn or dysphagia    Objective:   Physical Exam  Constitutional: She is oriented to person, place, and time. She appears well-developed. No distress.  HENT:  Mouth/Throat: Oropharynx is clear and moist. No oropharyngeal exudate.  Neck: No thyromegaly present.  Cardiovascular: Normal rate, regular rhythm, normal heart sounds and intact distal pulses. Exam reveals no gallop.  No murmur heard. Respiratory: Effort normal and breath sounds normal. No respiratory distress. She has no wheezes. She has no rales.  GI: Soft. There is no abdominal tenderness.  Musculoskeletal:        General: No tenderness or edema.  Lymphadenopathy:    She has no cervical adenopathy.  Neurological: She is alert and oriented to person, place, and time.  President--- "Kylie Kim, black gentleman---Kylie Kim, Kylie Kim" [id number] D-l-o-r-w Recall 3/3  Skin: No rash noted.  Irritated skin tag over left eye ~4-435mm long  Psychiatric: She has a normal mood and affect. Her behavior is normal.           Assessment & Plan:

## 2019-05-23 NOTE — Assessment & Plan Note (Signed)
Episodes sound more like migraine aura ASA aborts No action unless changes

## 2019-05-23 NOTE — Assessment & Plan Note (Signed)
I have personally reviewed the Medicare Annual Wellness questionnaire and have noted 1. The patient's medical and social history 2. Their use of alcohol, tobacco or illicit drugs 3. Their current medications and supplements 4. The patient's functional ability including ADL's, fall risks, home safety risks and hearing or visual             impairment. 5. Diet and physical activities 6. Evidence for depression or mood disorders  The patients weight, height, BMI and visual acuity have been recorded in the chart I have made referrals, counseling and provided education to the patient based review of the above and I have provided the pt with a written personalized care plan for preventive services.  I have provided you with a copy of your personalized plan for preventive services. Please take the time to review along with your updated medication list.  No cancer screening due to age Flu vaccine today Discussed fitness  Consider shingrix at pharmacy

## 2019-05-23 NOTE — Assessment & Plan Note (Signed)
Ongoing issues Will make miralax bid, then add senna-s prn If not effective, will try linzess

## 2019-05-23 NOTE — Assessment & Plan Note (Signed)
BP Readings from Last 3 Encounters:  05/23/19 128/84  03/14/19 140/80  03/06/19 (!) 160/80   Doing well with the medication now

## 2019-05-23 NOTE — Assessment & Plan Note (Signed)
See social history 

## 2019-05-23 NOTE — Progress Notes (Signed)
Hearing Screening   Method: Audiometry   125Hz  250Hz  500Hz  1000Hz  2000Hz  3000Hz  4000Hz  6000Hz  8000Hz   Right ear:   0 0 40  0    Left ear:   40 40 40  0    Vision Screening Comments: August 2020

## 2019-05-23 NOTE — Assessment & Plan Note (Signed)
Mild No Rx 

## 2019-05-23 NOTE — Assessment & Plan Note (Signed)
Discussed alternatives Verbal consent Liquid nitrogen 30 seconds x 2  Tolerated well Discussed home care

## 2019-05-23 NOTE — Patient Instructions (Signed)
Please increase the miralax to twice a day. If that doesn't work, or you have problems with the higher dose, try adding senna-s 2 capsules once or twice a day. If that doesn't work, let me know and we can try a prescription medication.

## 2019-05-24 ENCOUNTER — Ambulatory Visit: Payer: Medicare Other | Admitting: Podiatry

## 2020-04-09 ENCOUNTER — Other Ambulatory Visit: Payer: Self-pay | Admitting: Internal Medicine

## 2020-05-27 ENCOUNTER — Other Ambulatory Visit: Payer: Self-pay

## 2020-05-27 ENCOUNTER — Ambulatory Visit (INDEPENDENT_AMBULATORY_CARE_PROVIDER_SITE_OTHER): Payer: Medicare Other | Admitting: Internal Medicine

## 2020-05-27 ENCOUNTER — Encounter: Payer: Self-pay | Admitting: Internal Medicine

## 2020-05-27 VITALS — BP 138/84 | HR 68 | Temp 97.6°F | Ht 63.0 in | Wt 130.0 lb

## 2020-05-27 DIAGNOSIS — G43109 Migraine with aura, not intractable, without status migrainosus: Secondary | ICD-10-CM

## 2020-05-27 DIAGNOSIS — K5909 Other constipation: Secondary | ICD-10-CM | POA: Diagnosis not present

## 2020-05-27 DIAGNOSIS — I10 Essential (primary) hypertension: Secondary | ICD-10-CM | POA: Diagnosis not present

## 2020-05-27 DIAGNOSIS — G250 Essential tremor: Secondary | ICD-10-CM

## 2020-05-27 DIAGNOSIS — Z23 Encounter for immunization: Secondary | ICD-10-CM

## 2020-05-27 DIAGNOSIS — Z7189 Other specified counseling: Secondary | ICD-10-CM

## 2020-05-27 DIAGNOSIS — Z0000 Encounter for general adult medical examination without abnormal findings: Secondary | ICD-10-CM

## 2020-05-27 LAB — CBC
HCT: 40.3 % (ref 36.0–46.0)
Hemoglobin: 13.3 g/dL (ref 12.0–15.0)
MCHC: 33.1 g/dL (ref 30.0–36.0)
MCV: 93.8 fl (ref 78.0–100.0)
Platelets: 166 10*3/uL (ref 150.0–400.0)
RBC: 4.29 Mil/uL (ref 3.87–5.11)
RDW: 13.1 % (ref 11.5–15.5)
WBC: 4.4 10*3/uL (ref 4.0–10.5)

## 2020-05-27 LAB — COMPREHENSIVE METABOLIC PANEL
ALT: 10 U/L (ref 0–35)
AST: 16 U/L (ref 0–37)
Albumin: 4.4 g/dL (ref 3.5–5.2)
Alkaline Phosphatase: 84 U/L (ref 39–117)
BUN: 17 mg/dL (ref 6–23)
CO2: 30 mEq/L (ref 19–32)
Calcium: 9.7 mg/dL (ref 8.4–10.5)
Chloride: 103 mEq/L (ref 96–112)
Creatinine, Ser: 0.84 mg/dL (ref 0.40–1.20)
GFR: 63.69 mL/min (ref >60.00)
Glucose, Bld: 97 mg/dL (ref 70–99)
Potassium: 4.2 mEq/L (ref 3.5–5.1)
Sodium: 139 mEq/L (ref 135–145)
Total Bilirubin: 0.5 mg/dL (ref 0.2–1.2)
Total Protein: 7 g/dL (ref 6.0–8.3)

## 2020-05-27 NOTE — Addendum Note (Signed)
Addended by: Eual Fines on: 05/27/2020 11:42 AM   Modules accepted: Orders

## 2020-05-27 NOTE — Progress Notes (Signed)
Subjective:    Patient ID: Kylie Kim, female    DOB: 1928/08/29, 86 y.o.   MRN: [id number]  HPI Here for Medicare wellness visit and follow up of chronic medical conditions This visit occurred during the SARS-CoV-2 public health emergency.  Safety protocols were in place, including screening questions prior to the visit, additional usage of staff PPE, and extensive cleaning of exam room while observing appropriate contact time as indicated for disinfecting solutions.   Reviewed form and advanced directives Reviewed other doctors Vision is still an issue---finally getting the cataract surgery Hearing is not great--but not bad enough for aides Occasional glass of wine No tobacco No set exercise---but stays active No falls No depression or anhedonia Independent with instrumental ADLs Some recall issues but no significant issues with memory  Has her spells rarely Gets "zig zags" in her vision Takes an aspirin and it goes away  Bowels okay Taking miralax nightly No blood  Still on BP med Seems to be controlling fine No edema No chest pain or SOB No dizziness or syncope No palpitations  Tremor persists  Still mild so no Rx  Current Outpatient Medications on File Prior to Visit  Medication Sig Dispense Refill  . amLODipine (NORVASC) 5 MG tablet TAKE 1 TABLET BY MOUTH EVERY DAY 90 tablet 3  . Multiple Vitamins-Minerals (ABC PLUS SENIOR) TABS Take 1 tablet by mouth daily.    . polyethylene glycol powder (GLYCOLAX/MIRALAX) powder Take 17 g by mouth every other day.     No current facility-administered medications on file prior to visit.    Allergies  Allergen Reactions  . Alendronate Sodium     REACTION: unspecified  . Oxycodone-Acetaminophen     REACTION: unspecified    Past Medical History:  Diagnosis Date  . Essential and other specified forms of tremor   . History of breast cancer 07/96  . Hyperlipidemia   . Osteoporosis   . Poor memory   . Post  herpetic neuralgia 01/07  . Raynaud's disease   . TIA (transient ischemic attack)    "several".  No deficits    Past Surgical History:  Procedure Laterality Date  . ABDOMINAL HYSTERECTOMY    . BREAST LUMPECTOMY  07/96   surgery at [facility]    History reviewed. No pertinent family history.  Social History   Socioeconomic History  . Marital status: Married    Spouse name: Not on file  . Number of children: 2  . Years of education: Not on file  . Highest education level: Not on file  Occupational History  . Occupation: retired [employer]  Tobacco Use  . Smoking status: Never Smoker  . Smokeless tobacco: Never Used  Vaping Use  . Vaping Use: Never used  Substance and Sexual Activity  . Alcohol use: Yes    Comment: may have drink 1x/month  . Drug use: No  . Sexual activity: Never  Other Topics Concern  . Not on file  Social History Narrative   Has living will   Would want husband, then son Scottie/daughter Andrey Campanile, should make health care decisions. (not positive she has health care POA)   Would accept resuscitation attempts but no prolonged ventilation   Does not want tube feeds if cognitively unaware      Social Determinants of Health   Financial Resource Strain:   . Difficulty of Paying Living Expenses: Not on file  Food Insecurity:   . Worried About Programme researcher, broadcasting/film/video in the Last Year: Not on  file  . Ran Out of Food in the Last Year: Not on file  Transportation Needs:   . Lack of Transportation (Medical): Not on file  . Lack of Transportation (Non-Medical): Not on file  Physical Activity:   . Days of Exercise per Week: Not on file  . Minutes of Exercise per Session: Not on file  Stress:   . Feeling of Stress : Not on file  Social Connections:   . Frequency of Communication with Friends and Family: Not on file  . Frequency of Social Gatherings with Friends and Family: Not on file  . Attends Religious Services: Not on file  . Active Member of Clubs or Organizations:  Not on file  . Attends Banker Meetings: Not on file  . Marital Status: Not on file  Intimate Partner Violence:   . Fear of Current or Ex-Partner: Not on file  . Emotionally Abused: Not on file  . Physically Abused: Not on file  . Sexually Abused: Not on file   Review of Systems Appetite is good Weight is stable Sleeps well Wears seat belt Teeth fine--regular with dentist No skin lesions or rashes Voids fine--- pads for some urge incontinence (mostly night) No heartburn or dysphagia No back or joint pains    Objective:   Physical Exam Constitutional:      Appearance: Normal appearance.  HENT:     Mouth/Throat:     Comments: No oral lesions Eyes:     Conjunctiva/sclera: Conjunctivae normal.     Pupils: Pupils are equal, round, and reactive to light.  Cardiovascular:     Rate and Rhythm: Normal rate and regular rhythm.     Pulses: Normal pulses.     Heart sounds: No murmur heard.  No gallop.   Pulmonary:     Effort: Pulmonary effort is normal.     Breath sounds: Normal breath sounds. No wheezing or rales.  Abdominal:     Palpations: Abdomen is soft.     Tenderness: There is no abdominal tenderness.  Musculoskeletal:     Cervical back: Neck supple.     Right lower leg: No edema.     Left lower leg: No edema.  Lymphadenopathy:     Cervical: No cervical adenopathy.  Skin:    General: Skin is warm.     Findings: No rash.  Neurological:     Mental Status: She is alert and oriented to person, place, and time.     Comments: President--- "Duane Boston, Trump, Obama" [phone] D-l-r-o-w Recall 3/3  Psychiatric:        Mood and Affect: Mood normal.        Behavior: Behavior normal.            Assessment & Plan:

## 2020-05-27 NOTE — Assessment & Plan Note (Signed)
I have personally reviewed the Medicare Annual Wellness questionnaire and have noted 1. The patient's medical and social history 2. Their use of alcohol, tobacco or illicit drugs 3. Their current medications and supplements 4. The patient's functional ability including ADL's, fall risks, home safety risks and hearing or visual             impairment. 5. Diet and physical activities 6. Evidence for depression or mood disorders  The patients weight, height, BMI and visual acuity have been recorded in the chart I have made referrals, counseling and provided education to the patient based review of the above and I have provided the pt with a written personalized care plan for preventive services.  I have provided you with a copy of your personalized plan for preventive services. Please take the time to review along with your updated medication list.  No cancer screening due to age Discussed leg strengthening to add to her activities Td booster if any injury Flu vaccine today

## 2020-05-27 NOTE — Assessment & Plan Note (Signed)
Goes regularly with daily miralax--1 capful

## 2020-05-27 NOTE — Assessment & Plan Note (Signed)
Same visual aura 1 aspirin does abort the sensation and no headache

## 2020-05-27 NOTE — Assessment & Plan Note (Signed)
Mild  Still okay without meds for this

## 2020-05-27 NOTE — Assessment & Plan Note (Signed)
See social history 

## 2020-05-27 NOTE — Progress Notes (Signed)
Hearing Screening   Method: Audiometry   125Hz  250Hz  500Hz  1000Hz  2000Hz  3000Hz  4000Hz  6000Hz  8000Hz   Right ear:   0 0 40  0    Left ear:   40 40 40  0    Vision Screening Comments: September 2021

## 2020-05-27 NOTE — Assessment & Plan Note (Signed)
BP Readings from Last 3 Encounters:  05/27/20 138/84  05/23/19 128/84  03/14/19 140/80   Okay on amlodipine 5mg  daily Will check labs

## 2020-06-03 ENCOUNTER — Other Ambulatory Visit: Payer: Self-pay

## 2020-06-03 ENCOUNTER — Encounter: Payer: Self-pay | Admitting: Ophthalmology

## 2020-06-06 ENCOUNTER — Other Ambulatory Visit: Payer: Self-pay

## 2020-06-06 ENCOUNTER — Other Ambulatory Visit
Admission: RE | Admit: 2020-06-06 | Discharge: 2020-06-06 | Disposition: A | Discharge status: Home / Self Care | Payer: Medicare Other | Source: Ambulatory Visit | Attending: Ophthalmology | Admitting: Ophthalmology

## 2020-06-06 DIAGNOSIS — Z01812 Encounter for preprocedural laboratory examination: Secondary | ICD-10-CM | POA: Insufficient documentation

## 2020-06-06 DIAGNOSIS — Z20822 Contact with and (suspected) exposure to covid-19: Secondary | ICD-10-CM | POA: Diagnosis not present

## 2020-06-06 NOTE — Discharge Instructions (Signed)

## 2020-06-07 LAB — SARS CORONAVIRUS 2 (TAT 6-24 HRS): SARS Coronavirus 2: NEGATIVE

## 2020-06-10 ENCOUNTER — Ambulatory Visit: Payer: Medicare Other | Admitting: Anesthesiology

## 2020-06-10 ENCOUNTER — Encounter: Payer: Self-pay | Admitting: Ophthalmology

## 2020-06-10 ENCOUNTER — Encounter
Admission: RE | Disposition: A | Discharge status: Home / Self Care | Payer: Self-pay | Source: Home / Self Care | Attending: Ophthalmology

## 2020-06-10 ENCOUNTER — Ambulatory Visit
Admission: RE | Admit: 2020-06-10 | Discharge: 2020-06-10 | Disposition: A | Discharge status: Home / Self Care | Payer: Medicare Other | Attending: Ophthalmology | Admitting: Ophthalmology

## 2020-06-10 ENCOUNTER — Other Ambulatory Visit: Payer: Self-pay

## 2020-06-10 DIAGNOSIS — B0229 Other postherpetic nervous system involvement: Secondary | ICD-10-CM | POA: Diagnosis not present

## 2020-06-10 DIAGNOSIS — E785 Hyperlipidemia, unspecified: Secondary | ICD-10-CM | POA: Insufficient documentation

## 2020-06-10 DIAGNOSIS — Z8673 Personal history of transient ischemic attack (TIA), and cerebral infarction without residual deficits: Secondary | ICD-10-CM | POA: Insufficient documentation

## 2020-06-10 DIAGNOSIS — M810 Age-related osteoporosis without current pathological fracture: Secondary | ICD-10-CM | POA: Insufficient documentation

## 2020-06-10 DIAGNOSIS — H2512 Age-related nuclear cataract, left eye: Secondary | ICD-10-CM | POA: Diagnosis not present

## 2020-06-10 DIAGNOSIS — I10 Essential (primary) hypertension: Secondary | ICD-10-CM | POA: Insufficient documentation

## 2020-06-10 DIAGNOSIS — I7300 Raynaud's syndrome without gangrene: Secondary | ICD-10-CM | POA: Insufficient documentation

## 2020-06-10 HISTORY — PX: CATARACT EXTRACTION W/PHACO: SHX586

## 2020-06-10 HISTORY — DX: Essential (primary) hypertension: I10

## 2020-06-10 SURGERY — PHACOEMULSIFICATION, CATARACT, WITH IOL INSERTION
Anesthesia: Monitor Anesthesia Care | Site: Eye | Laterality: Left

## 2020-06-10 MED ORDER — NA CHONDROIT SULF-NA HYALURON 40-17 MG/ML IO SOLN
INTRAOCULAR | Status: DC | PRN
  Administered 2020-06-10: 1 mL via INTRAOCULAR

## 2020-06-10 MED ORDER — EPINEPHRINE PF 1 MG/ML IJ SOLN
INTRAOCULAR | Status: DC | PRN
  Administered 2020-06-10: 61 mL via OPHTHALMIC

## 2020-06-10 MED ORDER — ARMC OPHTHALMIC DILATING DROPS
1.0000 "application " | OPHTHALMIC | Status: DC | PRN
  Administered 2020-06-10 (×3): 1 via OPHTHALMIC

## 2020-06-10 MED ORDER — BRIMONIDINE TARTRATE-TIMOLOL 0.2-0.5 % OP SOLN
OPHTHALMIC | Status: DC | PRN
  Administered 2020-06-10: 1 [drp] via OPHTHALMIC

## 2020-06-10 MED ORDER — FENTANYL CITRATE (PF) 100 MCG/2ML IJ SOLN
INTRAMUSCULAR | Status: DC | PRN
  Administered 2020-06-10: 50 ug via INTRAVENOUS

## 2020-06-10 MED ORDER — TETRACAINE HCL 0.5 % OP SOLN
1.0000 [drp] | OPHTHALMIC | Status: DC | PRN
  Administered 2020-06-10 (×3): 1 [drp] via OPHTHALMIC

## 2020-06-10 MED ORDER — LIDOCAINE HCL (PF) 2 % IJ SOLN
INTRAOCULAR | Status: DC | PRN
  Administered 2020-06-10: 2 mL

## 2020-06-10 MED ORDER — MIDAZOLAM HCL 2 MG/2ML IJ SOLN
INTRAMUSCULAR | Status: DC | PRN
  Administered 2020-06-10: 1 mg via INTRAVENOUS

## 2020-06-10 MED ORDER — MOXIFLOXACIN HCL 0.5 % OP SOLN
OPHTHALMIC | Status: DC | PRN
  Administered 2020-06-10: 0.2 mL via OPHTHALMIC

## 2020-06-10 SURGICAL SUPPLY — 19 items
CANNULA ANT/CHMB 27G (MISCELLANEOUS) ×2 IMPLANT
CANNULA ANT/CHMB 27GA (MISCELLANEOUS) ×6 IMPLANT
GLOVE SURG LX 8.0 MICRO (GLOVE) ×2
GLOVE SURG LX STRL 8.0 MICRO (GLOVE) ×1 IMPLANT
GLOVE SURG TRIUMPH 8.0 PF LTX (GLOVE) ×3 IMPLANT
GOWN STRL REUS W/ TWL LRG LVL3 (GOWN DISPOSABLE) ×2 IMPLANT
GOWN STRL REUS W/TWL LRG LVL3 (GOWN DISPOSABLE) ×6
LENS IOL TECNIS EYHANCE 18.5 (Intraocular Lens) ×2 IMPLANT
MARKER SKIN DUAL TIP RULER LAB (MISCELLANEOUS) ×3 IMPLANT
NDL FILTER BLUNT 18X1 1/2 (NEEDLE) ×1 IMPLANT
NEEDLE FILTER BLUNT 18X 1/2SAF (NEEDLE) ×2
NEEDLE FILTER BLUNT 18X1 1/2 (NEEDLE) ×1 IMPLANT
PACK EYE AFTER SURG (MISCELLANEOUS) ×3 IMPLANT
PACK OPTHALMIC (MISCELLANEOUS) ×3 IMPLANT
PACK PORFILIO (MISCELLANEOUS) ×3 IMPLANT
SYR 3ML LL SCALE MARK (SYRINGE) ×3 IMPLANT
SYR TB 1ML LUER SLIP (SYRINGE) ×3 IMPLANT
WATER STERILE IRR 250ML POUR (IV SOLUTION) ×3 IMPLANT
WIPE NON LINTING 3.25X3.25 (MISCELLANEOUS) ×3 IMPLANT

## 2020-06-10 NOTE — Transfer of Care (Signed)
Immediate Anesthesia Transfer of Care Note  Patient: Kylie Kim  Procedure(s) Performed: CATARACT EXTRACTION PHACO AND INTRAOCULAR LENS PLACEMENT (IOC) LEFT 10.81 01:05.1 (Left Eye)  Patient Location: PACU  Anesthesia Type: MAC  Level of Consciousness: awake, alert  and patient cooperative  Airway and Oxygen Therapy: Patient Spontanous Breathing and Patient connected to supplemental oxygen  Post-op Assessment: Post-op Vital signs reviewed, Patient's Cardiovascular Status Stable, Respiratory Function Stable, Patent Airway and No signs of Nausea or vomiting  Post-op Vital Signs: Reviewed and stable  Complications: No complications documented.

## 2020-06-10 NOTE — Anesthesia Postprocedure Evaluation (Signed)
Anesthesia Post Note  Patient: Engineer, civil (consulting)  Procedure(s) Performed: CATARACT EXTRACTION PHACO AND INTRAOCULAR LENS PLACEMENT (IOC) LEFT 10.81 01:05.1 (Left Eye)     Patient location during evaluation: PACU Anesthesia Type: MAC Level of consciousness: awake Pain management: pain level controlled Vital Signs Assessment: post-procedure vital signs reviewed and stable Respiratory status: respiratory function stable Cardiovascular status: stable Postop Assessment: no apparent nausea or vomiting Anesthetic complications: no   No complications documented.  Veda Canning

## 2020-06-10 NOTE — H&P (Signed)
[facility]   Primary Care Physician:  Karie Schwalbe, MD Ophthalmologist: Dr. Druscilla Brownie  Pre-Procedure History & Physical: HPI:  Kylie Kim is a 86 y.o. female here for cataract surgery.   Past Medical History:  Diagnosis Date  . Essential and other specified forms of tremor   . History of breast cancer 07/96  . Hyperlipidemia   . Hypertension   . Osteoporosis   . Poor memory   . Post herpetic neuralgia 01/07  . Raynaud's disease   . TIA (transient ischemic attack)    "several".  No deficits    Past Surgical History:  Procedure Laterality Date  . ABDOMINAL HYSTERECTOMY    . BREAST LUMPECTOMY  07/96   surgery at [facility]    Prior to Admission medications   Medication Sig Start Date End Date Taking? Authorizing Provider  amLODipine (NORVASC) 5 MG tablet TAKE 1 TABLET BY MOUTH EVERY DAY 04/09/20  Yes Karie Schwalbe, MD  Multiple Vitamins-Minerals (ABC PLUS SENIOR) TABS Take 1 tablet by mouth daily.   Yes [provider]  polyethylene glycol powder (GLYCOLAX/MIRALAX) powder Take 17 g by mouth every other day.   Yes [provider]    Allergies as of 05/08/2020 - Review Complete 05/23/2019  Allergen Reaction Noted  . Alendronate sodium  09/30/2006  . Oxycodone-acetaminophen  09/30/2006    History reviewed. No pertinent family history.  Social History   Socioeconomic History  . Marital status: Married    Spouse name: Not on file  . Number of children: 2  . Years of education: Not on file  . Highest education level: Not on file  Occupational History  . Occupation: retired [employer]  Tobacco Use  . Smoking status: Never Smoker  . Smokeless tobacco: Never Used  Vaping Use  . Vaping Use: Never used  Substance and Sexual Activity  . Alcohol use: Yes    Comment: may have drink 1x/month  . Drug use: No  . Sexual activity: Never  Other Topics Concern  . Not on file  Social History Narrative   Has living will   Would want husband,  then son Scottie/daughter Andrey Campanile, should make health care decisions. (not positive she has health care POA)   Would accept resuscitation attempts but no prolonged ventilation   Does not want tube feeds if cognitively unaware      Social Determinants of Health   Financial Resource Strain:   . Difficulty of Paying Living Expenses: Not on file  Food Insecurity:   . Worried About Programme researcher, broadcasting/film/video in the Last Year: Not on file  . Ran Out of Food in the Last Year: Not on file  Transportation Needs:   . Lack of Transportation (Medical): Not on file  . Lack of Transportation (Non-Medical): Not on file  Physical Activity:   . Days of Exercise per Week: Not on file  . Minutes of Exercise per Session: Not on file  Stress:   . Feeling of Stress : Not on file  Social Connections:   . Frequency of Communication with Friends and Family: Not on file  . Frequency of Social Gatherings with Friends and Family: Not on file  . Attends Religious Services: Not on file  . Active Member of Clubs or Organizations: Not on file  . Attends Banker Meetings: Not on file  . Marital Status: Not on file  Intimate Partner Violence:   . Fear of Current or Ex-Partner: Not on file  . Emotionally  Abused: Not on file  . Physically Abused: Not on file  . Sexually Abused: Not on file    Review of Systems: See HPI, otherwise negative ROS  Physical Exam: BP (!) 159/71   Pulse 84   Temp 97.7 F (36.5 C) (Temporal)   Resp 16   Ht 5\' 3"  (1.6 m)   Wt 58.5 kg   SpO2 99%   BMI 22.83 kg/m  General:   Alert,  pleasant and cooperative in NAD Head:  Normocephalic and atraumatic. Lungs:  Clear to auscultation.    Heart:  Regular rate and rhythm.   Impression/Plan: is here for cataract surgery.  Risks, benefits, limitations, and alternatives regarding cataract surgery have been reviewed with the patient.  Questions have been answered.  All parties agreeable.   Janene Madeira, MD  06/10/2020, 7:53 AM

## 2020-06-10 NOTE — Anesthesia Preprocedure Evaluation (Addendum)
Anesthesia Evaluation  Patient identified by MRN, date of birth, ID band Patient awake    Reviewed: Allergy & Precautions, NPO status , Patient's Chart, lab work & pertinent test results  Airway Mallampati: II  TM Distance: >3 FB     Dental   Pulmonary    breath sounds clear to auscultation       Cardiovascular hypertension,  Rhythm:Regular Rate:Normal  Raynaud's HLD   Neuro/Psych Post herpetic neuralgia TIA   GI/Hepatic negative GI ROS,   Endo/Other    Renal/GU      Musculoskeletal Osteoporosis   Abdominal   Peds  Hematology   Anesthesia Other Findings   Reproductive/Obstetrics                             Anesthesia Physical  Anesthesia Plan  ASA: II  Anesthesia Plan: MAC   Post-op Pain Management:    Induction:   PONV Risk Score and Plan: Midazolam  Airway Management Planned: Nasal Cannula and Natural Airway  Additional Equipment:   Intra-op Plan:   Post-operative Plan:   Informed Consent: I have reviewed the patients History and Physical, chart, labs and discussed the procedure including the risks, benefits and alternatives for the proposed anesthesia with the patient or authorized representative who has indicated his/her understanding and acceptance.       Plan Discussed with: CRNA  Anesthesia Plan Comments:        Anesthesia Quick Evaluation

## 2020-06-10 NOTE — Op Note (Signed)
PREOPERATIVE DIAGNOSIS:  Nuclear sclerotic cataract of the left eye.   POSTOPERATIVE DIAGNOSIS:  Nuclear sclerotic cataract of the left eye.   OPERATIVE PROCEDURE:@   SURGEON:  Galen Manila, MD.   ANESTHESIA:  Anesthesiologist: Jola Babinski, MD CRNA: Maree Krabbe, CRNA  1.      Managed anesthesia care. 2.     0.27ml of Shugarcaine was instilled following the paracentesis   COMPLICATIONS:  None.   TECHNIQUE:   Stop and chop   DESCRIPTION OF PROCEDURE:  The patient was examined and consented in the preoperative holding area where the aforementioned topical anesthesia was applied to the left eye and then brought back to the Operating Room where the left eye was prepped and draped in the usual sterile ophthalmic fashion and a lid speculum was placed. A paracentesis was created with the side port blade and the anterior chamber was filled with viscoelastic. A near clear corneal incision was performed with the steel keratome. A continuous curvilinear capsulorrhexis was performed with a cystotome followed by the capsulorrhexis forceps. Hydrodissection and hydrodelineation were carried out with BSS on a blunt cannula. The lens was removed in a stop and chop  technique and the remaining cortical material was removed with the irrigation-aspiration handpiece. The capsular bag was inflated with viscoelastic and the Technis ZCB00 lens was placed in the capsular bag without complication. The remaining viscoelastic was removed from the eye with the irrigation-aspiration handpiece. The wounds were hydrated. The anterior chamber was flushed with BSS and the eye was inflated to physiologic pressure. 0.57ml Vigamox was placed in the anterior chamber. The wounds were found to be water tight. The eye was dressed with Combigan. The patient was given protective glasses to wear throughout the day and a shield with which to sleep tonight. The patient was also given drops with which to begin a drop regimen today and  will follow-up with me in one day. Implant Name Type Inv. Item Serial No. Manufacturer Lot No. LRB No. Used Action  LENS II EYHANCE 18.5 - [id number] Intraocular Lens LENS II EYHANCE 18.5 [id number] JOHNSON   Left 1 Implanted    Procedure(s): CATARACT EXTRACTION PHACO AND INTRAOCULAR LENS PLACEMENT (IOC) LEFT 10.81 01:05.1 (Left)  Electronically signed: Galen Manila 06/10/2020 8:17 AM

## 2020-06-10 NOTE — Anesthesia Procedure Notes (Signed)
Procedure Name: MAC Date/Time: 06/10/2020 8:00 AM Performed by: Cameron Ali, CRNA Pre-anesthesia Checklist: Patient identified, Emergency Drugs available, Suction available, Timeout performed and Patient being monitored Patient Re-evaluated:Patient Re-evaluated prior to induction Oxygen Delivery Method: Nasal cannula Placement Confirmation: positive ETCO2

## 2020-06-11 ENCOUNTER — Encounter: Payer: Self-pay | Admitting: Ophthalmology

## 2020-06-25 ENCOUNTER — Encounter: Payer: Self-pay | Admitting: Ophthalmology

## 2020-06-25 ENCOUNTER — Other Ambulatory Visit: Payer: Self-pay

## 2020-06-27 ENCOUNTER — Other Ambulatory Visit
Admission: RE | Admit: 2020-06-27 | Discharge: 2020-06-27 | Disposition: A | Discharge status: Home / Self Care | Payer: Medicare Other | Source: Ambulatory Visit | Attending: Ophthalmology | Admitting: Ophthalmology

## 2020-06-27 ENCOUNTER — Other Ambulatory Visit: Payer: Self-pay

## 2020-06-27 DIAGNOSIS — Z01812 Encounter for preprocedural laboratory examination: Secondary | ICD-10-CM | POA: Insufficient documentation

## 2020-06-27 DIAGNOSIS — Z20822 Contact with and (suspected) exposure to covid-19: Secondary | ICD-10-CM | POA: Insufficient documentation

## 2020-06-27 NOTE — Anesthesia Preprocedure Evaluation (Addendum)
Anesthesia Evaluation  Patient identified by MRN, date of birth, ID band Patient awake    Reviewed: Allergy & Precautions, NPO status , Patient's Chart, lab work & pertinent test results, reviewed documented beta blocker date and time   History of Anesthesia Complications Negative for: history of anesthetic complications  Airway Mallampati: III  TM Distance: >3 FB Neck ROM: Limited    Dental   Pulmonary    breath sounds clear to auscultation       Cardiovascular hypertension, (-) angina(-) DOE  Rhythm:Regular Rate:Normal  Raynaud's HLD   Neuro/Psych Post herpetic neuralgia TIA   GI/Hepatic neg GERD  ,  Endo/Other    Renal/GU      Musculoskeletal Osteoporosis   Abdominal   Peds  Hematology   Anesthesia Other Findings Breast cancer  Reproductive/Obstetrics                            Anesthesia Physical  Anesthesia Plan  ASA: II  Anesthesia Plan: MAC   Post-op Pain Management:    Induction: Intravenous  PONV Risk Score and Plan: 2 and Midazolam, Treatment may vary due to age or medical condition and TIVA  Airway Management Planned: Nasal Cannula and Natural Airway  Additional Equipment:   Intra-op Plan:   Post-operative Plan:   Informed Consent: I have reviewed the patients History and Physical, chart, labs and discussed the procedure including the risks, benefits and alternatives for the proposed anesthesia with the patient or authorized representative who has indicated his/her understanding and acceptance.       Plan Discussed with: CRNA  Anesthesia Plan Comments:         Anesthesia Quick Evaluation

## 2020-06-27 NOTE — Discharge Instructions (Signed)
General Anesthesia, Adult, Care After This sheet gives you information about how to care for yourself after your procedure. Your health care provider may also give you more specific instructions. If you have problems or questions, contact your health care provider. What can I expect after the procedure? After the procedure, the following side effects are common:  Pain or discomfort at the IV site.  Nausea.  Vomiting.  Sore throat.  Trouble concentrating.  Feeling cold or chills.  Weak or tired.  Sleepiness and fatigue.  Soreness and body aches. These side effects can affect parts of the body that were not involved in surgery. Follow these instructions at home:  For at least 24 hours after the procedure:  Have a responsible adult stay with you. It is important to have someone help care for you until you are awake and alert.  Rest as needed.  Do not: ? Participate in activities in which you could fall or become injured. ? Drive. ? Use heavy machinery. ? Drink alcohol. ? Take sleeping pills or medicines that cause drowsiness. ? Make important decisions or sign legal documents. ? Take care of children on your own. Eating and drinking  Follow any instructions from your health care provider about eating or drinking restrictions.  When you feel hungry, start by eating small amounts of foods that are soft and easy to digest (bland), such as toast. Gradually return to your regular diet.  Drink enough fluid to keep your urine pale yellow.  If you vomit, rehydrate by drinking water, juice, or clear broth. General instructions  If you have sleep apnea, surgery and certain medicines can increase your risk for breathing problems. Follow instructions from your health care provider about wearing your sleep device: ? Anytime you are sleeping, including during daytime naps. ? While taking prescription pain medicines, sleeping medicines, or medicines that make you drowsy.  Return to  your normal activities as told by your health care provider. Ask your health care provider what activities are safe for you.  Take over-the-counter and prescription medicines only as told by your health care provider.  If you smoke, do not smoke without supervision.  Keep all follow-up visits as told by your health care provider. This is important. Contact a health care provider if:  You have nausea or vomiting that does not get better with medicine.  You cannot eat or drink without vomiting.  You have pain that does not get better with medicine.  You are unable to pass urine.  You develop a skin rash.  You have a fever.  You have redness around your IV site that gets worse. Get help right away if:  You have difficulty breathing.  You have chest pain.  You have blood in your urine or stool, or you vomit blood. Summary  After the procedure, it is common to have a sore throat or nausea. It is also common to feel tired.  Have a responsible adult stay with you for the first 24 hours after general anesthesia. It is important to have someone help care for you until you are awake and alert.  When you feel hungry, start by eating small amounts of foods that are soft and easy to digest (bland), such as toast. Gradually return to your regular diet.  Drink enough fluid to keep your urine pale yellow.  Return to your normal activities as told by your health care provider. Ask your health care provider what activities are safe for you. This information is not   intended to replace advice given to you by your health care provider. Make sure you discuss any questions you have with your health care provider. Document Revised: 08/26/2017 Document Reviewed: 04/08/2017 Elsevier Patient Education  2020 Elsevier Inc.  Cataract Surgery, Care After This sheet gives you information about how to care for yourself after your procedure. Your health care provider may also give you more specific  instructions. If you have problems or questions, contact your health care provider. What can I expect after the procedure? After the procedure, it is common to have:  Itching.  Discomfort.  Fluid discharge.  Sensitivity to light and to touch.  Bruising in or around the eye.  Mild blurred vision. Follow these instructions at home: Eye care   Do not touch or rub your eyes.  Protect your eyes as told by your health care provider. You may be told to wear a protective eye shield or sunglasses.  Do not put a contact lens into the affected eye or eyes until your health care provider approves.  Keep the area around your eye clean and dry: ? Avoid swimming. ? Do not allow water to hit you directly in the face while showering. ? Keep soap and shampoo out of your eyes.  Check your eye every day for signs of infection. Watch for: ? Redness, swelling, or pain. ? Fluid, blood, or pus. ? Warmth. ? A bad smell. ? Vision that is getting worse. ? Sensitivity that is getting worse. Activity  Do not drive for 24 hours if you were given a sedative during your procedure.  Avoid strenuous activities, such as playing contact sports, for as long as told by your health care provider.  Do not drive or use heavy machinery until your health care provider approves.  Do not bend or lift heavy objects. Bending increases pressure in the eye. You can walk, climb stairs, and do light household chores.  Ask your health care provider when you can return to work. If you work in a dusty environment, you may be advised to wear protective eyewear for a period of time. General instructions  Take or apply over-the-counter and prescription medicines only as told by your health care provider. This includes eye drops.  Keep all follow-up visits as told by your health care provider. This is important. Contact a health care provider if:  You have increased bruising around your eye.  You have pain that is  not helped with medicine.  You have a fever.  You have redness, swelling, or pain in your eye.  You have fluid, blood, or pus coming from your incision.  Your vision gets worse.  Your sensitivity to light gets worse. Get help right away if:  You have sudden loss of vision.  You see flashes of light or spots (floaters).  You have severe eye pain.  You develop nausea or vomiting. Summary  After your procedure, it is common to have itching, discomfort, bruising, fluid discharge, or sensitivity to light.  Follow instructions from your health care provider about caring for your eye after the procedure.  Do not rub your eye after the procedure. You may need to wear eye protection or sunglasses. Do not wear contact lenses. Keep the area around your eye clean and dry.  Avoid activities that require a lot of effort. These include playing sports and lifting heavy objects.  Contact a health care provider if you have increased bruising, pain that does not go away, or a fever. Get help right   away if you suddenly lose your vision, see flashes of light or spots, or have severe pain in the eye. This information is not intended to replace advice given to you by your health care provider. Make sure you discuss any questions you have with your health care provider. Document Revised: 06/19/2019 Document Reviewed: 02/20/2018 Elsevier Patient Education  2020 Elsevier Inc.  

## 2020-06-28 LAB — SARS CORONAVIRUS 2 (TAT 6-24 HRS): SARS Coronavirus 2: NEGATIVE

## 2020-07-01 ENCOUNTER — Other Ambulatory Visit: Payer: Self-pay

## 2020-07-01 ENCOUNTER — Ambulatory Visit
Admission: RE | Admit: 2020-07-01 | Discharge: 2020-07-01 | Disposition: A | Discharge status: Home / Self Care | Payer: Medicare Other | Attending: Ophthalmology | Admitting: Ophthalmology

## 2020-07-01 ENCOUNTER — Encounter: Payer: Self-pay | Admitting: Ophthalmology

## 2020-07-01 ENCOUNTER — Ambulatory Visit: Payer: Medicare Other | Admitting: Anesthesiology

## 2020-07-01 ENCOUNTER — Encounter
Admission: RE | Disposition: A | Discharge status: Home / Self Care | Payer: Self-pay | Source: Home / Self Care | Attending: Ophthalmology

## 2020-07-01 DIAGNOSIS — Z885 Allergy status to narcotic agent status: Secondary | ICD-10-CM | POA: Insufficient documentation

## 2020-07-01 DIAGNOSIS — H2511 Age-related nuclear cataract, right eye: Secondary | ICD-10-CM | POA: Insufficient documentation

## 2020-07-01 DIAGNOSIS — Z888 Allergy status to other drugs, medicaments and biological substances status: Secondary | ICD-10-CM | POA: Diagnosis not present

## 2020-07-01 HISTORY — PX: CATARACT EXTRACTION W/PHACO: SHX586

## 2020-07-01 SURGERY — PHACOEMULSIFICATION, CATARACT, WITH IOL INSERTION
Anesthesia: Monitor Anesthesia Care | Site: Eye | Laterality: Right

## 2020-07-01 MED ORDER — FENTANYL CITRATE (PF) 100 MCG/2ML IJ SOLN
INTRAMUSCULAR | Status: DC | PRN
  Administered 2020-07-01: 50 ug via INTRAVENOUS

## 2020-07-01 MED ORDER — EPINEPHRINE PF 1 MG/ML IJ SOLN: INTRAOCULAR | Status: DC | PRN

## 2020-07-01 MED ORDER — ONDANSETRON HCL 4 MG/2ML IJ SOLN: 4.0000 mg | Freq: Once | INTRAMUSCULAR | Status: DC | PRN

## 2020-07-01 MED ORDER — NA CHONDROIT SULF-NA HYALURON 40-17 MG/ML IO SOLN
INTRAOCULAR | Status: DC | PRN
  Administered 2020-07-01: 1 mL via INTRAOCULAR

## 2020-07-01 MED ORDER — MIDAZOLAM HCL 2 MG/2ML IJ SOLN
INTRAMUSCULAR | Status: DC | PRN
  Administered 2020-07-01: 1 mg via INTRAVENOUS

## 2020-07-01 MED ORDER — MOXIFLOXACIN HCL 0.5 % OP SOLN
OPHTHALMIC | Status: DC | PRN
  Administered 2020-07-01: 0.2 mL via OPHTHALMIC

## 2020-07-01 MED ORDER — ACETAMINOPHEN 10 MG/ML IV SOLN: 1000.0000 mg | Freq: Once | INTRAVENOUS | Status: DC | PRN

## 2020-07-01 MED ORDER — TETRACAINE HCL 0.5 % OP SOLN
1.0000 [drp] | OPHTHALMIC | Status: DC | PRN
  Administered 2020-07-01 (×3): 1 [drp] via OPHTHALMIC

## 2020-07-01 MED ORDER — LACTATED RINGERS IV SOLN: INTRAVENOUS | Status: DC

## 2020-07-01 MED ORDER — ARMC OPHTHALMIC DILATING DROPS
1.0000 "application " | OPHTHALMIC | Status: DC | PRN
  Administered 2020-07-01 (×3): 1 via OPHTHALMIC

## 2020-07-01 MED ORDER — LIDOCAINE HCL (PF) 2 % IJ SOLN
INTRAOCULAR | Status: DC | PRN
  Administered 2020-07-01: 2 mL

## 2020-07-01 MED ORDER — BRIMONIDINE TARTRATE-TIMOLOL 0.2-0.5 % OP SOLN
OPHTHALMIC | Status: DC | PRN
  Administered 2020-07-01: 1 [drp] via OPHTHALMIC

## 2020-07-01 SURGICAL SUPPLY — 19 items
CANNULA ANT/CHMB 27GA (MISCELLANEOUS) ×6 IMPLANT
GLOVE SURG LX 8.0 MICRO (GLOVE) ×4
GLOVE SURG LX STRL 8.0 MICRO (GLOVE) ×2 IMPLANT
GLOVE SURG TRIUMPH 8.0 PF LTX (GLOVE) ×3 IMPLANT
GOWN STRL REUS W/ TWL LRG LVL3 (GOWN DISPOSABLE) ×2 IMPLANT
GOWN STRL REUS W/TWL LRG LVL3 (GOWN DISPOSABLE) ×6
LENS IOL TECNIS EYHANCE 17.0 (Intraocular Lens) ×3 IMPLANT
MARKER SKIN DUAL TIP RULER LAB (MISCELLANEOUS) ×3 IMPLANT
NEEDLE FILTER BLUNT 18X 1/2SAF (NEEDLE) ×2
NEEDLE FILTER BLUNT 18X1 1/2 (NEEDLE) ×1 IMPLANT
PACK EYE AFTER SURG (MISCELLANEOUS) ×3 IMPLANT
PACK OPTHALMIC (MISCELLANEOUS) ×3 IMPLANT
PACK PORFILIO (MISCELLANEOUS) ×3 IMPLANT
SUT ETHILON 10-0 CS-B-6CS-B-6 (SUTURE)
SUTURE EHLN 10-0 CS-B-6CS-B-6 (SUTURE) IMPLANT
SYR 3ML LL SCALE MARK (SYRINGE) ×3 IMPLANT
SYR TB 1ML LUER SLIP (SYRINGE) ×3 IMPLANT
WATER STERILE IRR 250ML POUR (IV SOLUTION) ×3 IMPLANT
WIPE NON LINTING 3.25X3.25 (MISCELLANEOUS) ×3 IMPLANT

## 2020-07-01 NOTE — Anesthesia Procedure Notes (Signed)
Procedure Name: MAC Performed by: Juaquina Machnik, CRNA Pre-anesthesia Checklist: Patient identified, Emergency Drugs available, Suction available, Timeout performed and Patient being monitored Patient Re-evaluated:Patient Re-evaluated prior to induction Oxygen Delivery Method: Nasal cannula Placement Confirmation: positive ETCO2       

## 2020-07-01 NOTE — Op Note (Signed)
PREOPERATIVE DIAGNOSIS:  Nuclear sclerotic cataract of the right eye.   POSTOPERATIVE DIAGNOSIS:  Cataract   OPERATIVE PROCEDURE:@   SURGEON:  Galen Manila, MD.   ANESTHESIA:  Anesthesiologist: Heniser, Burman Foster, MD CRNA: Jimmy Picket, CRNA  1.      Managed anesthesia care. 2.      0.67ml of Shugarcaine was instilled in the eye following the paracentesis.   COMPLICATIONS:  None.   TECHNIQUE:   Stop and chop   DESCRIPTION OF PROCEDURE:  The patient was examined and consented in the preoperative holding area where the aforementioned topical anesthesia was applied to the right eye and then brought back to the Operating Room where the right eye was prepped and draped in the usual sterile ophthalmic fashion and a lid speculum was placed. A paracentesis was created with the side port blade and the anterior chamber was filled with viscoelastic. A near clear corneal incision was performed with the steel keratome. A continuous curvilinear capsulorrhexis was performed with a cystotome followed by the capsulorrhexis forceps. Hydrodissection and hydrodelineation were carried out with BSS on a blunt cannula. The lens was removed in a stop and chop  technique and the remaining cortical material was removed with the irrigation-aspiration handpiece. The capsular bag was inflated with viscoelastic and the Technis ZCB00  lens was placed in the capsular bag without complication. The remaining viscoelastic was removed from the eye with the irrigation-aspiration handpiece. The wounds were hydrated. The anterior chamber was flushed with BSS and the eye was inflated to physiologic pressure. 0.33ml of Vigamox was placed in the anterior chamber. The wounds were found to be water tight. The eye was dressed with Combigan. The patient was given protective glasses to wear throughout the day and a shield with which to sleep tonight. The patient was also given drops with which to begin a drop regimen today and will  follow-up with me in one day. Implant Name Type Inv. Item Serial No. Manufacturer Lot No. LRB No. Used Action  LENS IOL TECNIS EYHANCE 17.0 - [id number] Intraocular Lens LENS IOL TECNIS EYHANCE 17.0 [id number] JOHNSON   Right 1 Implanted   Procedure(s) with comments: CATARACT EXTRACTION PHACO AND INTRAOCULAR LENS PLACEMENT (IOC) RIGHT (Right) - 10.74 1:03.7  Electronically signed: Galen Manila 07/01/2020 1:02 PM

## 2020-07-01 NOTE — H&P (Signed)
[facility]   Primary Care Physician:  Karie Schwalbe, MD Ophthalmologist: Dr. Willey Blade  Pre-Procedure History & Physical: HPI:  Kylie Kim is a 86 y.o. female here for cataract surgery.   Past Medical History:  Diagnosis Date  . Essential and other specified forms of tremor   . History of breast cancer 07/96  . Hyperlipidemia   . Hypertension   . Osteoporosis   . Poor memory   . Post herpetic neuralgia 01/07  . Raynaud's disease   . TIA (transient ischemic attack)    "several".  No deficits    Past Surgical History:  Procedure Laterality Date  . ABDOMINAL HYSTERECTOMY    . BREAST LUMPECTOMY  07/96   surgery at [facility]  . CATARACT EXTRACTION W/PHACO Left 06/10/2020   Procedure: CATARACT EXTRACTION PHACO AND INTRAOCULAR LENS PLACEMENT (IOC) LEFT 10.81 01:05.1;  Surgeon: Galen Manila, MD;  Location: [facility];  Service: Ophthalmology;  Laterality: Left;    Prior to Admission medications   Medication Sig Start Date End Date Taking? Authorizing Provider  amLODipine (NORVASC) 5 MG tablet TAKE 1 TABLET BY MOUTH EVERY DAY 04/09/20  Yes Karie Schwalbe, MD  Multiple Vitamins-Minerals (ABC PLUS SENIOR) TABS Take 1 tablet by mouth daily.   Yes [provider]  polyethylene glycol powder (GLYCOLAX/MIRALAX) powder Take 17 g by mouth every other day.   Yes [provider]    Allergies as of 06/12/2020 - Review Complete 06/10/2020  Allergen Reaction Noted  . Alendronate sodium  09/30/2006  . Oxycodone-acetaminophen  09/30/2006    History reviewed. No pertinent family history.  Social History   Socioeconomic History  . Marital status: Married    Spouse name: Not on file  . Number of children: 2  . Years of education: Not on file  . Highest education level: Not on file  Occupational History  . Occupation: retired [employer]  Tobacco Use  . Smoking status: Never Smoker  . Smokeless tobacco: Never Used  Vaping Use  . Vaping  Use: Never used  Substance and Sexual Activity  . Alcohol use: Yes    Comment: may have drink 1x/month  . Drug use: No  . Sexual activity: Never  Other Topics Concern  . Not on file  Social History Narrative   Has living will   Would want husband, then son Kylie Kim/daughter Kylie Kim, should make health care decisions. (not positive she has health care POA)   Would accept resuscitation attempts but no prolonged ventilation   Does not want tube feeds if cognitively unaware      Social Determinants of Health   Financial Resource Strain:   . Difficulty of Paying Living Expenses: Not on file  Food Insecurity:   . Worried About Programme researcher, broadcasting/film/video in the Last Year: Not on file  . Ran Out of Food in the Last Year: Not on file  Transportation Needs:   . Lack of Transportation (Medical): Not on file  . Lack of Transportation (Non-Medical): Not on file  Physical Activity:   . Days of Exercise per Week: Not on file  . Minutes of Exercise per Session: Not on file  Stress:   . Feeling of Stress : Not on file  Social Connections:   . Frequency of Communication with Friends and Family: Not on file  . Frequency of Social Gatherings with Friends and Family: Not on file  . Attends Religious Services: Not on file  . Active Member of Clubs or Organizations:  Not on file  . Attends Banker Meetings: Not on file  . Marital Status: Not on file  Intimate Partner Violence:   . Fear of Current or Ex-Partner: Not on file  . Emotionally Abused: Not on file  . Physically Abused: Not on file  . Sexually Abused: Not on file    Review of Systems: See HPI, otherwise negative ROS  Physical Exam: BP (!) 175/67   Pulse 73   Temp 97.7 F (36.5 C) (Temporal)   Ht 5\' 3"  (1.6 m)   Wt 58.1 kg   SpO2 99%   BMI 22.67 kg/m  General:   Alert,  pleasant and cooperative in NAD Head:  Normocephalic and atraumatic. Lungs:  Clear to auscultation.    Heart:  Regular rate and  rhythm.  Impression/Plan: is here for cataract surgery.  Risks, benefits, limitations, and alternatives regarding cataract surgery have been reviewed with the patient.  Questions have been answered.  All parties agreeable.   Janene Madeira, MD  07/01/2020, 12:30 PM

## 2020-07-01 NOTE — Transfer of Care (Signed)
Immediate Anesthesia Transfer of Care Note  Patient: Kylie Kim  Procedure(s) Performed: CATARACT EXTRACTION PHACO AND INTRAOCULAR LENS PLACEMENT (IOC) RIGHT (Right Eye)  Patient Location: PACU  Anesthesia Type: MAC  Level of Consciousness: awake, alert  and patient cooperative  Airway and Oxygen Therapy: Patient Spontanous Breathing and Patient connected to supplemental oxygen  Post-op Assessment: Post-op Vital signs reviewed, Patient's Cardiovascular Status Stable, Respiratory Function Stable, Patent Airway and No signs of Nausea or vomiting  Post-op Vital Signs: Reviewed and stable  Complications: No complications documented.

## 2020-07-01 NOTE — Anesthesia Postprocedure Evaluation (Signed)
Anesthesia Post Note  Patient: Kylie Kim, Kylie (consulting)  Procedure(s) Performed: CATARACT EXTRACTION PHACO AND INTRAOCULAR LENS PLACEMENT (IOC) RIGHT (Right Eye)     Patient location during evaluation: PACU Anesthesia Type: MAC Level of consciousness: awake and alert Pain management: pain level controlled Vital Signs Assessment: post-procedure vital signs reviewed and stable Respiratory status: spontaneous breathing, nonlabored ventilation, respiratory function stable and patient connected to nasal cannula oxygen Cardiovascular status: stable and blood pressure returned to baseline Postop Assessment: no apparent nausea or vomiting Anesthetic complications: no   No complications documented.  Kylie Kim A  Kylie Kim

## 2020-07-03 ENCOUNTER — Encounter: Payer: Self-pay | Admitting: Ophthalmology

## 2020-07-14 ENCOUNTER — Ambulatory Visit: Payer: Medicare Other | Attending: Critical Care Medicine

## 2020-07-14 DIAGNOSIS — Z23 Encounter for immunization: Secondary | ICD-10-CM

## 2020-07-14 NOTE — Progress Notes (Signed)
   Covid-19 Vaccination Clinic  Name:  Shylynn Bruning    MRN: [id number] DOB: 26-Jul-1928  07/14/2020  Ms. Droll was observed post Covid-19 immunization for 15 minutes without incident. She was provided with Vaccine Information Sheet and instruction to access the V-Safe system.   Ms. Flannigan was instructed to call 911 with any severe reactions post vaccine: Marland Kitchen Difficulty breathing  . Swelling of face and throat  . A fast heartbeat  . A bad rash all over body  . Dizziness and weakness

## 2021-04-02 ENCOUNTER — Other Ambulatory Visit: Payer: Self-pay | Admitting: Internal Medicine

## 2021-05-29 ENCOUNTER — Encounter: Payer: Medicare Other | Admitting: Internal Medicine

## 2021-06-24 ENCOUNTER — Other Ambulatory Visit: Payer: Self-pay

## 2021-06-24 ENCOUNTER — Encounter: Payer: Self-pay | Admitting: Internal Medicine

## 2021-06-24 ENCOUNTER — Ambulatory Visit (INDEPENDENT_AMBULATORY_CARE_PROVIDER_SITE_OTHER): Payer: Medicare Other | Admitting: Internal Medicine

## 2021-06-24 VITALS — BP 130/74 | HR 72 | Temp 97.4°F | Ht 62.75 in | Wt 132.0 lb

## 2021-06-24 DIAGNOSIS — Z0000 Encounter for general adult medical examination without abnormal findings: Secondary | ICD-10-CM

## 2021-06-24 DIAGNOSIS — K5909 Other constipation: Secondary | ICD-10-CM | POA: Diagnosis not present

## 2021-06-24 DIAGNOSIS — Z23 Encounter for immunization: Secondary | ICD-10-CM

## 2021-06-24 DIAGNOSIS — I10 Essential (primary) hypertension: Secondary | ICD-10-CM

## 2021-06-24 DIAGNOSIS — R413 Other amnesia: Secondary | ICD-10-CM | POA: Diagnosis not present

## 2021-06-24 DIAGNOSIS — Z7189 Other specified counseling: Secondary | ICD-10-CM | POA: Diagnosis not present

## 2021-06-24 LAB — COMPREHENSIVE METABOLIC PANEL
ALT: 11 U/L (ref 0–35)
AST: 14 U/L (ref 0–37)
Albumin: 4 g/dL (ref 3.5–5.2)
Alkaline Phosphatase: 89 U/L (ref 39–117)
BUN: 15 mg/dL (ref 6–23)
CO2: 29 mEq/L (ref 19–32)
Calcium: 9.4 mg/dL (ref 8.4–10.5)
Chloride: 103 mEq/L (ref 96–112)
Creatinine, Ser: 0.79 mg/dL (ref 0.40–1.20)
GFR: 65.52 mL/min (ref >60.00)
Glucose, Bld: 71 mg/dL (ref 70–99)
Potassium: 3.9 mEq/L (ref 3.5–5.1)
Sodium: 138 mEq/L (ref 135–145)
Total Bilirubin: 0.6 mg/dL (ref 0.2–1.2)
Total Protein: 6.2 g/dL (ref 6.0–8.3)

## 2021-06-24 LAB — VITAMIN B12: Vitamin B-12: 364 pg/mL (ref 211–911)

## 2021-06-24 LAB — CBC
HCT: 40 % (ref 36.0–46.0)
Hemoglobin: 13.1 g/dL (ref 12.0–15.0)
MCHC: 32.8 g/dL (ref 30.0–36.0)
MCV: 93.3 fl (ref 78.0–100.0)
Platelets: 189 10*3/uL (ref 150.0–400.0)
RBC: 4.29 Mil/uL (ref 3.87–5.11)
RDW: 14 % (ref 11.5–15.5)
WBC: 5 10*3/uL (ref 4.0–10.5)

## 2021-06-24 LAB — T4, FREE: Free T4: 0.68 ng/dL (ref 0.60–1.60)

## 2021-06-24 NOTE — Assessment & Plan Note (Signed)
Does well with the daily miralax

## 2021-06-24 NOTE — Progress Notes (Signed)
Hearing Screening  Method: Audiometry   500Hz  1000Hz  2000Hz  4000Hz   Right ear 0 0 0 0  Left ear 40 40 40 0   Vision Screening   Right eye Left eye Both eyes  Without correction     With correction 20/40 20/25 20/20

## 2021-06-24 NOTE — Assessment & Plan Note (Signed)
Mild and consistent with [facility] Will check labs Discussed driving (okay locally) Increase exercise and social engagement

## 2021-06-24 NOTE — Assessment & Plan Note (Signed)
I have personally reviewed the Medicare Annual Wellness questionnaire and have noted 1. The patient's medical and social history 2. Their use of alcohol, tobacco or illicit drugs 3. Their current medications and supplements 4. The patient's functional ability including ADL's, fall risks, home safety risks and hearing or visual             impairment. 5. Diet and physical activities 6. Evidence for depression or mood disorders  The patients weight, height, BMI and visual acuity have been recorded in the chart I have made referrals, counseling and provided education to the patient based review of the above and I have provided the pt with a written personalized care plan for preventive services.  I have provided you with a copy of your personalized plan for preventive services. Please take the time to review along with your updated medication list.  No cancer screening due to age Flu vaccine today Bivalent COVID vaccine at pharmacy soon Discussed exercise Td if any injury

## 2021-06-24 NOTE — Progress Notes (Signed)
Subjective:    Patient ID: Kylie Kim, female    DOB: 10/01/1927, 87 y.o.   MRN: [id number]  HPI Here with DIL Robin for Medicare wellness and follow up of chronic health issues This visit occurred during the SARS-CoV-2 public health emergency.  Safety protocols were in place, including screening questions prior to the visit, additional usage of staff PPE, and extensive cleaning of exam room while observing appropriate contact time as indicated for disinfecting solutions.   Reviewed advanced directives Reviewed other doctors---Dr Porfilio--ophthal, Dr Romona Curls Did have cataract surgery in October 2021. No other surgery or hospitalizations Vision good now Hearing is okay Still drives and does most of housework They have daily caregiver for her husband Occasional glass of wine No tobacco No set exercise--discussed. Tries to walk around house, etc No depression or anhedonia Mild memory issues---DIL notes occasional lapses  No new concerns No problems with BP med No dizziness or syncope No chest pain or SOB No edema  No longer has the visual aura No headaches Not taking ASA anymore  Mild tremor persists No functional issues so no meds  Bowels move okay with miralax No blood  Current Outpatient Medications on File Prior to Visit  Medication Sig Dispense Refill   amLODipine (NORVASC) 5 MG tablet TAKE 1 TABLET BY MOUTH EVERY DAY 90 tablet 3   Multiple Vitamins-Minerals (ABC PLUS SENIOR) TABS Take 1 tablet by mouth daily.     polyethylene glycol powder (GLYCOLAX/MIRALAX) powder Take 17 g by mouth every other day.     No current facility-administered medications on file prior to visit.    Allergies  Allergen Reactions   Alendronate Sodium     REACTION: unspecified   Oxycodone-Acetaminophen     REACTION: unspecified    Past Medical History:  Diagnosis Date   Essential and other specified forms of tremor    History of breast cancer 07/96    Hyperlipidemia    Hypertension    Osteoporosis    Poor memory    Post herpetic neuralgia 01/07   Raynaud's disease    TIA (transient ischemic attack)    "several".  No deficits    Past Surgical History:  Procedure Laterality Date   ABDOMINAL HYSTERECTOMY     BREAST LUMPECTOMY  07/96   surgery at [facility]   CATARACT EXTRACTION W/PHACO Left 06/10/2020   Procedure: CATARACT EXTRACTION PHACO AND INTRAOCULAR LENS PLACEMENT (IOC) LEFT 10.81 01:05.1;  Surgeon: Galen Manila, MD;  Location: [facility];  Service: Ophthalmology;  Laterality: Left;   CATARACT EXTRACTION W/PHACO Right 07/01/2020   Procedure: CATARACT EXTRACTION PHACO AND INTRAOCULAR LENS PLACEMENT (IOC) RIGHT;  Surgeon: Galen Manila, MD;  Location: [facility];  Service: Ophthalmology;  Laterality: Right;  10.74 1:03.7    History reviewed. No pertinent family history.  Social History   Socioeconomic History   Marital status: Married    Spouse name: Not on file   Number of children: 2   Years of education: Not on file   Highest education level: Not on file  Occupational History   Occupation: retired [employer]  Tobacco Use   Smoking status: Never   Smokeless tobacco: Never  Vaping Use   Vaping Use: Never used  Substance and Sexual Activity   Alcohol use: Yes    Comment: may have drink 1x/month   Drug use: No   Sexual activity: Never  Other Topics Concern   Not on file  Social History Narrative   Has living will  Would want husband, then son Scottie/daughter Andrey Campanile, should make health care decisions. (not positive she has health care POA)   Would accept resuscitation attempts but no prolonged ventilation   Does not want tube feeds if cognitively unaware      Social Determinants of Health   Financial Resource Strain: Not on file  Food Insecurity: Not on file  Transportation Needs: Not on file  Physical Activity: Not on file  Stress: Not on file  Social Connections: Not on file  Intimate  Partner Violence: Not on file   Review of Systems Appetite is good Weight stable Sleeps well Wears seat belt Teeth are fine--keeps up with dentist No suspicious skin lesions No heartburn or dysphagia No dysuria. No incontinence No sig back or joint pains    Objective:   Physical Exam Constitutional:      Appearance: Normal appearance.  HENT:     Mouth/Throat:     Comments: No lesions Eyes:     Conjunctiva/sclera: Conjunctivae normal.  Cardiovascular:     Rate and Rhythm: Normal rate and regular rhythm.     Pulses: Normal pulses.     Heart sounds: No murmur heard.   No gallop.  Pulmonary:     Effort: Pulmonary effort is normal.     Breath sounds: Normal breath sounds. No wheezing or rales.  Abdominal:     Palpations: Abdomen is soft.     Tenderness: There is no abdominal tenderness.  Musculoskeletal:     Cervical back: Neck supple.     Right lower leg: No edema.     Left lower leg: No edema.  Lymphadenopathy:     Cervical: No cervical adenopathy.  Skin:    Findings: No lesion or rash.  Neurological:     Mental Status: She is alert and oriented to person, place, and time.     Comments: President---? (Clinton, Obama?) [id number] D-l-r-o-w Recall 2/3  Mild head tremor  Psychiatric:        Mood and Affect: Mood normal.        Behavior: Behavior normal.           Assessment & Plan:

## 2021-06-24 NOTE — Addendum Note (Signed)
Addended by: Eual Fines on: 06/24/2021 08:54 AM   Modules accepted: Orders

## 2021-06-24 NOTE — Assessment & Plan Note (Signed)
BP Readings from Last 3 Encounters:  06/24/21 130/74  07/01/20 127/71  06/10/20 122/65   Good control on amlodipine Will check labs

## 2021-06-24 NOTE — Assessment & Plan Note (Signed)
See social history 

## 2021-07-07 ENCOUNTER — Ambulatory Visit: Payer: Medicare Other | Admitting: Nurse Practitioner

## 2021-07-15 ENCOUNTER — Ambulatory Visit (INDEPENDENT_AMBULATORY_CARE_PROVIDER_SITE_OTHER): Payer: Medicare Other | Admitting: Internal Medicine

## 2021-07-15 ENCOUNTER — Encounter: Payer: Self-pay | Admitting: Internal Medicine

## 2021-07-15 ENCOUNTER — Other Ambulatory Visit: Payer: Self-pay

## 2021-07-15 ENCOUNTER — Ambulatory Visit: Payer: Medicare Other | Admitting: Internal Medicine

## 2021-07-15 DIAGNOSIS — M79604 Pain in right leg: Secondary | ICD-10-CM | POA: Diagnosis not present

## 2021-07-15 NOTE — Assessment & Plan Note (Addendum)
Unclear etiology but no evidence of OA in hip or knee Pain not typical of sciatic pain--but best guess since down entire leg at times Nothing to suggest HNP  Okay to continue prn advil for now aspercreme or diclofenac topical  Next step is sports medicine evaluation

## 2021-07-15 NOTE — Patient Instructions (Signed)
Your can continue the aspercreme and also try over the counter diclofenac gel (especially if it keeps you from needing the advil). If things aren't improving in the next 1-2 weeks, I will set you up with my sports medicine partner, Dr Patsy Lager

## 2021-07-15 NOTE — Progress Notes (Signed)
Subjective:    Patient ID: Kylie Kim, female    DOB: 07-Feb-1928, 87 y.o.   MRN: [id number]  HPI Here due to right leg pain This visit occurred during the SARS-CoV-2 public health emergency.  Safety protocols were in place, including screening questions prior to the visit, additional usage of staff PPE, and extensive cleaning of exam room while observing appropriate contact time as indicated for disinfecting solutions.   Right leg is "aching terrible"--started 3 weeks ago Now seems to be worse Can hurt even if just sitting or in bed Much worse when walking on it though  No known injury Feels the aching around ankle/knee and even up by hip Started in posterior lower calf  No back pain No leg weakness No burning pain   Has tried advil 400mg ---this does help (dulls it) Aspercreme/heating pad help also  Current Outpatient Medications on File Prior to Visit  Medication Sig Dispense Refill   amLODipine (NORVASC) 5 MG tablet TAKE 1 TABLET BY MOUTH EVERY DAY 90 tablet 3   Multiple Vitamins-Minerals (ABC PLUS SENIOR) TABS Take 1 tablet by mouth daily.     polyethylene glycol powder (GLYCOLAX/MIRALAX) powder Take 17 g by mouth every other day. (Patient not taking: Reported on 07/15/2021)     No current facility-administered medications on file prior to visit.    Allergies  Allergen Reactions   Alendronate Sodium     REACTION: unspecified   Oxycodone-Acetaminophen     REACTION: unspecified    Past Medical History:  Diagnosis Date   Essential and other specified forms of tremor    History of breast cancer 07/96   Hyperlipidemia    Hypertension    Osteoporosis    Poor memory    Post herpetic neuralgia 01/07   Raynaud's disease    TIA (transient ischemic attack)    "several".  No deficits    Past Surgical History:  Procedure Laterality Date   ABDOMINAL HYSTERECTOMY     BREAST LUMPECTOMY  07/96   surgery at [facility]   CATARACT EXTRACTION W/PHACO Left 06/10/2020    Procedure: CATARACT EXTRACTION PHACO AND INTRAOCULAR LENS PLACEMENT (IOC) LEFT 10.81 01:05.1;  Surgeon: 08/10/2020, MD;  Location: [facility];  Service: Ophthalmology;  Laterality: Left;   CATARACT EXTRACTION W/PHACO Right 07/01/2020   Procedure: CATARACT EXTRACTION PHACO AND INTRAOCULAR LENS PLACEMENT (IOC) RIGHT;  Surgeon: 07/03/2020, MD;  Location: [facility];  Service: Ophthalmology;  Laterality: Right;  10.74 1:03.7    History reviewed. No pertinent family history.  Social History   Socioeconomic History   Marital status: Married    Spouse name: Not on file   Number of children: 2   Years of education: Not on file   Highest education level: Not on file  Occupational History   Occupation: retired [employer]  Tobacco Use   Smoking status: Never   Smokeless tobacco: Never  Vaping Use   Vaping Use: Never used  Substance and Sexual Activity   Alcohol use: Yes    Comment: may have drink 1x/month   Drug use: No   Sexual activity: Never  Other Topics Concern   Not on file  Social History Narrative   Has living will   Would want husband, then son Scottie/daughter 04-25-1984, should make health care decisions. (not positive she has health care POA)   Would accept resuscitation attempts but no prolonged ventilation   Does not want tube feeds if cognitively unaware      Social Determinants of  Health   Financial Resource Strain: Not on file  Food Insecurity: Not on file  Transportation Needs: Not on file  Physical Activity: Not on file  Stress: Not on file  Social Connections: Not on file  Intimate Partner Violence: Not on file   Review of Systems No leg swelling Foot not involved     Objective:   Physical Exam Constitutional:      Appearance: Normal appearance.  Musculoskeletal:     Comments: No spine tenderness Mild right S-I tenderness No sig right hip bursa tenderness Normal ROM in right hip, knee and ankle No ligament/meniscus findings  in knee SLR negative  Neurological:     Mental Status: She is alert.     Comments: Normal gait No weakness           Assessment & Plan:

## 2021-07-28 ENCOUNTER — Encounter: Payer: Self-pay | Admitting: Emergency Medicine

## 2021-07-28 ENCOUNTER — Emergency Department
Admission: EM | Admit: 2021-07-28 | Discharge: 2021-07-28 | Disposition: A | Discharge status: Home / Self Care | Payer: Medicare Other | Attending: Emergency Medicine | Admitting: Emergency Medicine

## 2021-07-28 ENCOUNTER — Other Ambulatory Visit: Payer: Self-pay

## 2021-07-28 ENCOUNTER — Emergency Department: Payer: Medicare Other

## 2021-07-28 DIAGNOSIS — M79661 Pain in right lower leg: Secondary | ICD-10-CM | POA: Insufficient documentation

## 2021-07-28 DIAGNOSIS — Z5321 Procedure and treatment not carried out due to patient leaving prior to being seen by health care provider: Secondary | ICD-10-CM | POA: Diagnosis not present

## 2021-07-28 DIAGNOSIS — G459 Transient cerebral ischemic attack, unspecified: Secondary | ICD-10-CM | POA: Diagnosis not present

## 2021-07-28 LAB — CBC
HCT: 40.9 % (ref 36.0–46.0)
Hemoglobin: 13.6 g/dL (ref 12.0–15.0)
MCH: 31.1 pg (ref 26.0–34.0)
MCHC: 33.3 g/dL (ref 30.0–36.0)
MCV: 93.6 fL (ref 80.0–100.0)
Platelets: 183 10*3/uL (ref 150–400)
RBC: 4.37 MIL/uL (ref 3.87–5.11)
RDW: 12.8 % (ref 11.5–15.5)
WBC: 6.4 10*3/uL (ref 4.0–10.5)
nRBC: 0 % (ref 0.0–0.2)

## 2021-07-28 LAB — COMPREHENSIVE METABOLIC PANEL
ALT: 11 U/L (ref 0–44)
AST: 19 U/L (ref 15–41)
Albumin: 4.1 g/dL (ref 3.5–5.0)
Alkaline Phosphatase: 84 U/L (ref 38–126)
Anion gap: 6 (ref 5–15)
BUN: 14 mg/dL (ref 8–23)
CO2: 25 mmol/L (ref 22–32)
Calcium: 9 mg/dL (ref 8.9–10.3)
Chloride: 102 mmol/L (ref 98–111)
Creatinine, Ser: 0.73 mg/dL (ref 0.44–1.00)
GFR, Estimated: >60 mL/min (ref >60)
Glucose, Bld: 114 mg/dL — ABNORMAL HIGH (ref 70–99)
Potassium: 4 mmol/L (ref 3.5–5.1)
Sodium: 133 mmol/L — ABNORMAL LOW (ref 135–145)
Total Bilirubin: 0.7 mg/dL (ref 0.3–1.2)
Total Protein: 6.7 g/dL (ref 6.5–8.1)

## 2021-07-28 LAB — DIFFERENTIAL
Abs Immature Granulocytes: 0.03 10*3/uL (ref 0.00–0.07)
Basophils Absolute: 0 10*3/uL (ref 0.0–0.1)
Basophils Relative: 0 %
Eosinophils Absolute: 0.1 10*3/uL (ref 0.0–0.5)
Eosinophils Relative: 1 %
Immature Granulocytes: 1 %
Lymphocytes Relative: 10 %
Lymphs Abs: 0.6 10*3/uL — ABNORMAL LOW (ref 0.7–4.0)
Monocytes Absolute: 0.5 10*3/uL (ref 0.1–1.0)
Monocytes Relative: 7 %
Neutro Abs: 5.2 10*3/uL (ref 1.7–7.7)
Neutrophils Relative %: 81 %

## 2021-07-28 LAB — TROPONIN I (HIGH SENSITIVITY): Troponin I (High Sensitivity): 6 ng/L (ref <18)

## 2021-07-28 LAB — PROTIME-INR
INR: 1.1 (ref 0.8–1.2)
Prothrombin Time: 14 seconds (ref 11.4–15.2)

## 2021-07-28 LAB — APTT: aPTT: 32 seconds (ref 24–36)

## 2021-07-28 NOTE — ED Triage Notes (Signed)
Pt comes into the ED via POV c/o right lower leg pain as well as TIA symptoms earlier today.  Pt's caregiver called the family and informed them that she had right side droop, slurring of speech and drooling earlier today.  All symptoms have resolved and patient has returned to her baseline per the family aside from the right lower leg pain.  Pt denies any blood thinner use.  PT's caregiver also stated her BP was 186/110 earlier today.  PT currently in NAD at this time with even and unlabored respirations and is ambulatory to triage at this time. Per the family the TIA symptoms were around 13:30 today.

## 2021-07-29 ENCOUNTER — Telehealth: Payer: Self-pay | Admitting: Internal Medicine

## 2021-07-29 MED ORDER — TRAMADOL HCL 50 MG PO TABS: 25.0000 mg | ORAL_TABLET | Freq: Three times a day (TID) | ORAL | 0 refills | Status: DC | PRN

## 2021-07-29 NOTE — Telephone Encounter (Signed)
Please let her know that I sent a strong pain medicine in for her to try (tramadol).  She was supposed to have been set up to see Dr Copland---can you make sure she got an appt?

## 2021-07-29 NOTE — Telephone Encounter (Signed)
Pt's caregiver is the person who called. Pt went to ER last night. Had a lot of workup but left without seeing a provider. Care Timeline  1526   Arrived  1536   Comprehensive metabolic panel     CBC    Protime-INR    APTT    Differential     Troponin I (High Sensitivity)  1541   ED EKG  1549   CT HEAD WO CONTRAST  1622   US Venous Img Lower Unilateral Right  2203   Discharged   I gave them info on [facility] just in case they need to see someone. She said pt is using the Aspercream and the Voltaren gel and they are not working. Wants something called in for pain.

## 2021-07-29 NOTE — Telephone Encounter (Signed)
Spoke to pt. They did xrays and set her up with a provider at [facility]. She will get the tramadol as they did not call in anything for her.

## 2021-07-29 NOTE — Telephone Encounter (Signed)
Actually, she ended up going to [facility] this afternoon. I am not sure what their treatment plan was. I will give her a call to see.

## 2021-07-29 NOTE — Telephone Encounter (Signed)
Pt called in stated after being seen in the ED was told to contact PCP for test results . And need something for her leg pain .Pleas advise [phone]

## 2021-08-05 ENCOUNTER — Other Ambulatory Visit: Payer: Self-pay | Admitting: Internal Medicine

## 2021-08-05 NOTE — Telephone Encounter (Signed)
Refill request for TRAMADOL HCL 50 MG TABLET  LOV - 07/15/21 Next OV - not scheduled Last refill - 07/29/21 #15/0

## 2021-08-27 ENCOUNTER — Other Ambulatory Visit: Payer: Self-pay | Admitting: Internal Medicine

## 2021-08-28 ENCOUNTER — Other Ambulatory Visit: Payer: Self-pay | Admitting: Internal Medicine

## 2021-08-28 MED ORDER — TRAMADOL HCL 50 MG PO TABS: 25.0000 mg | ORAL_TABLET | Freq: Three times a day (TID) | ORAL | 0 refills | Status: DC | PRN

## 2021-08-28 NOTE — Telephone Encounter (Signed)
Pt daughter called asking why was pt medication traMADol (ULTRAM) 50 MG tablet refused. Please advise.

## 2021-08-28 NOTE — Telephone Encounter (Signed)
4 out of 7 days a week with pain. She is taking it 3 times a day. Usually 1/2 tab unless severe and she takes a whole tab.

## 2022-03-19 ENCOUNTER — Other Ambulatory Visit: Payer: Self-pay | Admitting: Internal Medicine

## 2022-04-16 ENCOUNTER — Telehealth: Payer: Self-pay

## 2022-04-16 NOTE — Telephone Encounter (Signed)
[facility] Night - Client Nonclinical Telephone Record  [facility] Client [facility] Night - Client Client Site [facility] - Night Provider Tillman Abide- MD Contact Type Call Who Is Calling Patient / Member / Family / Caregiver Caller Name Azucena Kuba Caller Phone Number [phone] Patient Name Kylie Kim [facility] [place] [facility] Patient DOB [birth year] Call Type Message Only Information Provided Reason for Call Request to Schedule Office Appointment Initial Comment Caller states wanting to verify a patient's appointment. Patient request to speak to RN No Additional Comment Caller is not sure on the year for patient's DOB. Appointment is for flu shot. Disp. Time Disposition Final User 04/15/2022 6:11:04 PM General Information Provided Yes [place], [place] Call Closed By: Maralyn Sago Transaction Date/Time: 04/15/2022 6:05:39 PM (ET

## 2022-05-26 ENCOUNTER — Ambulatory Visit (INDEPENDENT_AMBULATORY_CARE_PROVIDER_SITE_OTHER): Payer: Medicare Other

## 2022-05-26 DIAGNOSIS — Z23 Encounter for immunization: Secondary | ICD-10-CM | POA: Diagnosis not present

## 2023-02-07 ENCOUNTER — Other Ambulatory Visit: Payer: Self-pay | Admitting: Internal Medicine

## 2023-02-07 NOTE — Telephone Encounter (Signed)
Lvm for patient tcb and schedule 

## 2023-02-07 NOTE — Telephone Encounter (Signed)
Patient scheduled for cpe

## 2023-02-07 NOTE — Telephone Encounter (Signed)
Amlodipine refill sent for 90 days. Pt is past due for a CPE/MCW with Dr Alphonsus Sias. Please help her get scheduled. Thanks.

## 2023-03-28 ENCOUNTER — Encounter: Payer: Self-pay | Admitting: Internal Medicine

## 2023-03-28 ENCOUNTER — Ambulatory Visit (INDEPENDENT_AMBULATORY_CARE_PROVIDER_SITE_OTHER): Payer: Medicare Other | Admitting: Internal Medicine

## 2023-03-28 VITALS — BP 130/76 | HR 66 | Temp 97.2°F | Ht 63.5 in | Wt 154.0 lb

## 2023-03-28 DIAGNOSIS — Z0000 Encounter for general adult medical examination without abnormal findings: Secondary | ICD-10-CM | POA: Diagnosis not present

## 2023-03-28 DIAGNOSIS — I10 Essential (primary) hypertension: Secondary | ICD-10-CM | POA: Diagnosis not present

## 2023-03-28 DIAGNOSIS — R413 Other amnesia: Secondary | ICD-10-CM

## 2023-03-28 DIAGNOSIS — G250 Essential tremor: Secondary | ICD-10-CM

## 2023-03-28 DIAGNOSIS — K5909 Other constipation: Secondary | ICD-10-CM | POA: Diagnosis not present

## 2023-03-28 LAB — COMPREHENSIVE METABOLIC PANEL
ALT: 16 U/L (ref 0–35)
AST: 22 U/L (ref 0–37)
Albumin: 4.2 g/dL (ref 3.5–5.2)
Alkaline Phosphatase: 114 U/L (ref 39–117)
BUN: 12 mg/dL (ref 6–23)
CO2: 27 mEq/L (ref 19–32)
Calcium: 10.1 mg/dL (ref 8.4–10.5)
Chloride: 102 mEq/L (ref 96–112)
Creatinine, Ser: 0.87 mg/dL (ref 0.40–1.20)
GFR: 57.64 mL/min — ABNORMAL LOW (ref >60.00)
Glucose, Bld: 91 mg/dL (ref 70–99)
Potassium: 4.1 mEq/L (ref 3.5–5.1)
Sodium: 138 mEq/L (ref 135–145)
Total Bilirubin: 0.6 mg/dL (ref 0.2–1.2)
Total Protein: 7.1 g/dL (ref 6.0–8.3)

## 2023-03-28 LAB — CBC
HCT: 43.6 % (ref 36.0–46.0)
Hemoglobin: 14 g/dL (ref 12.0–15.0)
MCHC: 32 g/dL (ref 30.0–36.0)
MCV: 92.3 fl (ref 78.0–100.0)
Platelets: 225 10*3/uL (ref 150.0–400.0)
RBC: 4.72 Mil/uL (ref 3.87–5.11)
RDW: 13.8 % (ref 11.5–15.5)
WBC: 7.9 10*3/uL (ref 4.0–10.5)

## 2023-03-28 LAB — VITAMIN B12: Vitamin B-12: 442 pg/mL (ref 211–911)

## 2023-03-28 LAB — TSH: TSH: 3.62 u[IU]/mL (ref 0.35–5.50)

## 2023-03-28 NOTE — Assessment & Plan Note (Signed)
Seems to still fit MCI category No longer drives---aide handles all instrumental ADLs--but she still does her ADLs and bills

## 2023-03-28 NOTE — Assessment & Plan Note (Signed)
I have personally reviewed the Medicare Annual Wellness questionnaire and have noted 1. The patient's medical and social history 2. Their use of alcohol, tobacco or illicit drugs 3. Their current medications and supplements 4. The patient's functional ability including ADL's, fall risks, home safety risks and hearing or visual             impairment. 5. Diet and physical activities 6. Evidence for depression or mood disorders  The patients weight, height, BMI and visual acuity have been recorded in the chart I have made referrals, counseling and provided education to the patient based review of the above and I have provided the pt with a written personalized care plan for preventive services.  I have provided you with a copy of your personalized plan for preventive services. Please take the time to review along with your updated medication list.  No cancer screening  Needs to start exercise Td at pharmacy--and consider the shingrix Update COVID/flu this fall--and RSV

## 2023-03-28 NOTE — Patient Instructions (Signed)
Please increase the mrialax to one capful twice a day. If your bowels are still slow, add 2 senna-s daily as well. You can get the tetanus booster and RSV at the pharmacy. Please update your COVID and flu vaccines in the fall

## 2023-03-28 NOTE — Assessment & Plan Note (Signed)
Mostly in head No Rx indicated

## 2023-03-28 NOTE — Assessment & Plan Note (Signed)
Will increase the miralax to twice a day Add senna-s prn

## 2023-03-28 NOTE — Progress Notes (Signed)
Hearing Screening - Comments:: Did not pass whisper test Vision Screening - Comments:: March 2024

## 2023-03-28 NOTE — Assessment & Plan Note (Signed)
BP Readings from Last 3 Encounters:  03/28/23 130/76  07/28/21 (!) 161/68  07/15/21 132/80   Controlled on the amlodipine 5mg  daily Will check labs

## 2023-03-28 NOTE — Progress Notes (Signed)
Subjective:    Patient ID: Kylie Kim, female    DOB: 27-Apr-1928, 89 y.o.   MRN: [id number]  HPI Here with aide for Medicare wellness visit and follow up of chronic health conditions Reviewed advanced directives Reviewed other doctors---Mr Darr--ortho, [place] [facility], Dr Pleas Koch Not really exercising---aide has to "push her" to even walk at all No hospitalizations or surgery in the past year Vision is fine Hearing is not good--discussed getting audiology appt Occasional glass of wine No tobacco  No falls No depression or anhedonia---does have some anxiety "frets some". Will get shakes with that  No longer driving  More trouble with constipation--despite the miralax daily Goes once a week or so No pain--but does get some bloating  Ongoing memory issues Aide notices it is worse--forgets things from 15 minutes ago Still active with friends Does ADLs and still handles checkbook Aide does all the housework/shopping  No chest pain No SOB No palpitations No dizziness or syncope No edema No headaches  Current Outpatient Medications on File Prior to Visit  Medication Sig Dispense Refill   amLODipine (NORVASC) 5 MG tablet TAKE 1 TABLET BY MOUTH EVERY DAY 90 tablet 0   Multiple Vitamins-Minerals (ABC PLUS SENIOR) TABS Take 1 tablet by mouth daily.     polyethylene glycol powder (GLYCOLAX/MIRALAX) powder Take 17 g by mouth every other day.     No current facility-administered medications on file prior to visit.    Allergies  Allergen Reactions   Alendronate Sodium     REACTION: unspecified   Oxycodone-Acetaminophen     REACTION: unspecified    Past Medical History:  Diagnosis Date   Essential and other specified forms of tremor    History of breast cancer 07/96   Hyperlipidemia    Hypertension    Osteoporosis    Poor memory    Post herpetic neuralgia 01/07   Raynaud's disease    TIA (transient ischemic attack)    "several".  No deficits     Past Surgical History:  Procedure Laterality Date   ABDOMINAL HYSTERECTOMY     BREAST LUMPECTOMY  07/96   surgery at [facility]   CATARACT EXTRACTION W/PHACO Left 06/10/2020   Procedure: CATARACT EXTRACTION PHACO AND INTRAOCULAR LENS PLACEMENT (IOC) LEFT 10.81 01:05.1;  Surgeon: Galen Manila, MD;  Location: [facility];  Service: Ophthalmology;  Laterality: Left;   CATARACT EXTRACTION W/PHACO Right 07/01/2020   Procedure: CATARACT EXTRACTION PHACO AND INTRAOCULAR LENS PLACEMENT (IOC) RIGHT;  Surgeon: Galen Manila, MD;  Location: [facility];  Service: Ophthalmology;  Laterality: Right;  10.74 1:03.7    Family History  Problem Relation Age of Onset   Cancer Sister     Social History   Socioeconomic History   Marital status: Married    Spouse name: Not on file   Number of children: 2   Years of education: Not on file   Highest education level: Not on file  Occupational History   Occupation: retired [employer]  Tobacco Use   Smoking status: Never   Smokeless tobacco: Never  Vaping Use   Vaping status: Never Used  Substance and Sexual Activity   Alcohol use: Yes    Comment: may have drink 1x/month   Drug use: No   Sexual activity: Never  Other Topics Concern   Not on file  Social History Narrative   Has living will   Would want husband, then son Scottie/daughter Andrey Campanile, should make health care decisions. (not positive she has health care POA)  Would accept resuscitation attempts but no prolonged ventilation   Does not want tube feeds if cognitively unaware      Social Determinants of Health   Financial Resource Strain: Not on file  Food Insecurity: Not on file  Transportation Needs: Not on file  Physical Activity: Not on file  Stress: Not on file  Social Connections: Not on file  Intimate Partner Violence: Not on file   Review of Systems Eating well--aide maintains good nutrition for her Weight is up 30# over 2 years Sleeps okay Wears seat  belt Teeth okay---keeps up with dentist No heartburn or dysphagia No dysuria/hematuria. Some urge incontinence--depends No sig back or joint pain No skin problems    Objective:   Physical Exam Constitutional:      Appearance: Normal appearance.  HENT:     Mouth/Throat:     Pharynx: No oropharyngeal exudate or posterior oropharyngeal erythema.  Eyes:     Conjunctiva/sclera: Conjunctivae normal.     Pupils: Pupils are equal, round, and reactive to light.  Cardiovascular:     Rate and Rhythm: Normal rate and regular rhythm.     Pulses: Normal pulses.     Heart sounds: No murmur heard.    No gallop.  Pulmonary:     Effort: Pulmonary effort is normal.     Breath sounds: Normal breath sounds. No wheezing or rales.  Abdominal:     Palpations: Abdomen is soft.     Tenderness: There is no abdominal tenderness.  Musculoskeletal:     Cervical back: Neck supple.     Right lower leg: No edema.     Left lower leg: No edema.  Lymphadenopathy:     Cervical: No cervical adenopathy.  Skin:    Findings: No rash.  Neurological:     General: No focal deficit present.     Mental Status: She is alert and oriented to person, place, and time.     Comments: President--"Biden, ?" D-l-o-r-w [id number] Resting head tremor  Psychiatric:        Mood and Affect: Mood normal.        Behavior: Behavior normal.            Assessment & Plan:

## 2023-04-30 IMAGING — CT CT HEAD W/O CM
4 series · 17 of 47 positions shown, 19 images · non-contrast
Comparison: None.

CLINICAL DATA: Right lower leg pain, TIA symptoms

EXAM:
CT HEAD WITHOUT CONTRAST
TECHNIQUE: Contiguous axial images were obtained from the base of the skull
through the vertex without intravenous contrast.

[Series 2: head wo · axial · 0.42mm/px · z∈[-57,+63]mm · 7 of 33 slices shown, 9 images]
[im 5/33  brain]
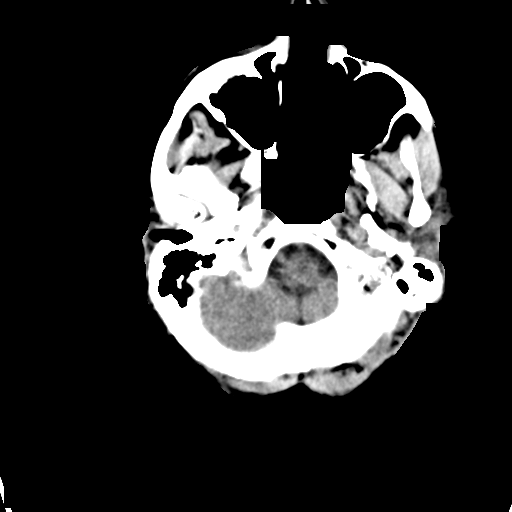
[im 5/33  bone]
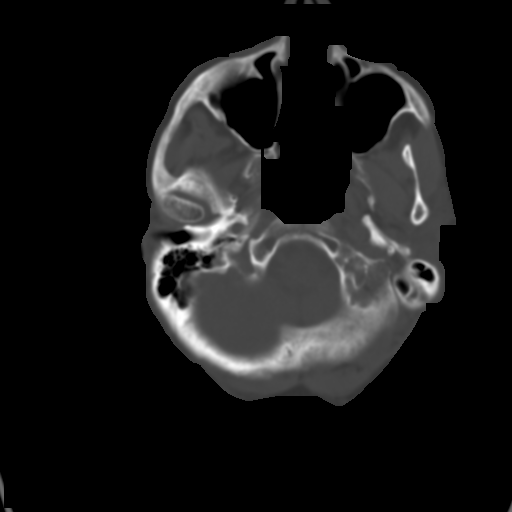
[im 9/33  brain]
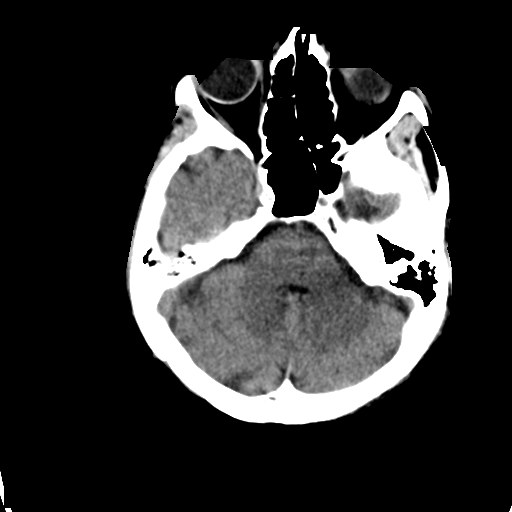
[im 13/33  brain]
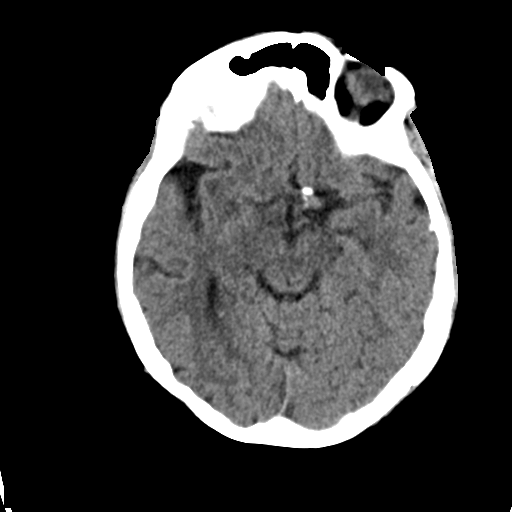
[im 17/33  brain]
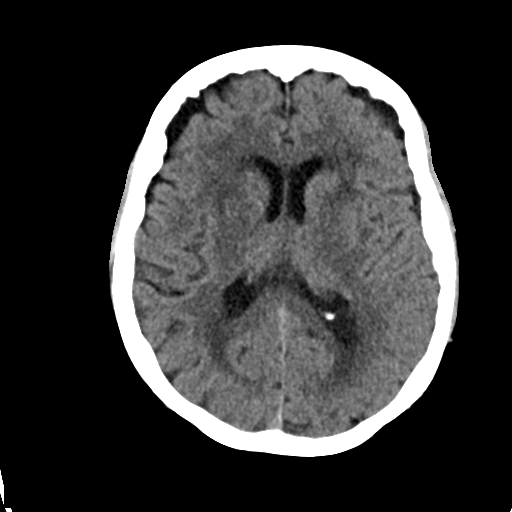
[im 21/33  brain]
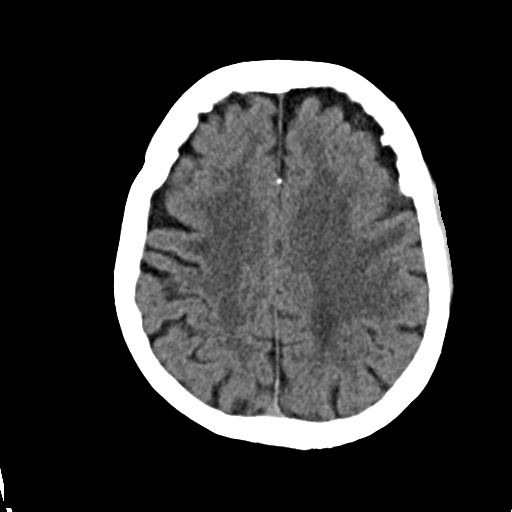
[im 21/33  bone]
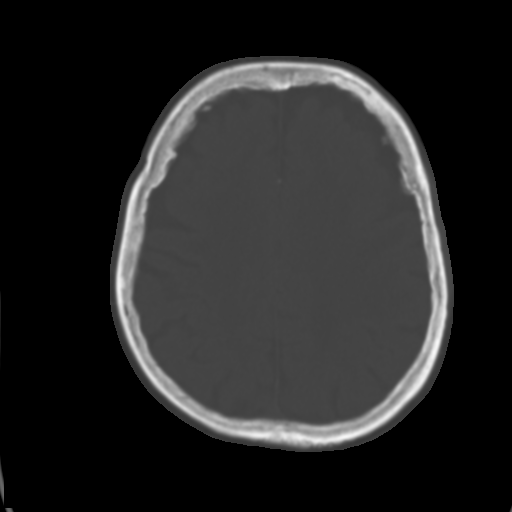
[im 25/33  brain]
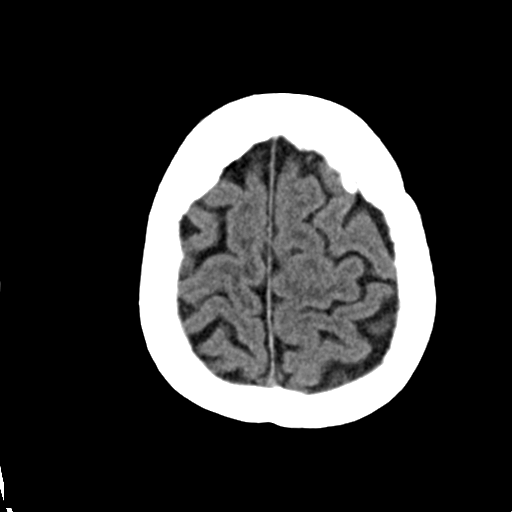
[im 29/33  brain]
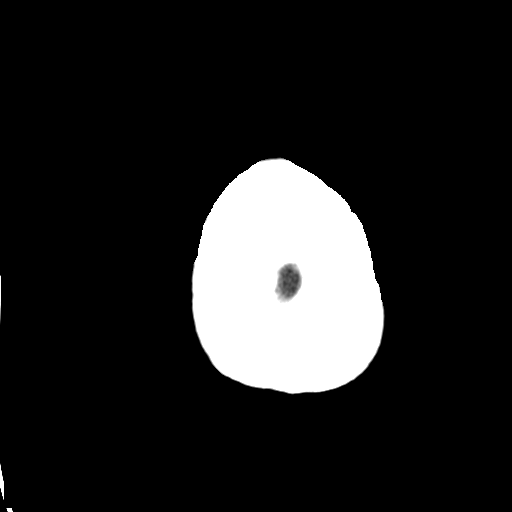

[Series 3: head bone · axial · 0.42mm/px · z∈[-61,-5]mm · 4 of 81 slices shown]
[im 9/81  bone]
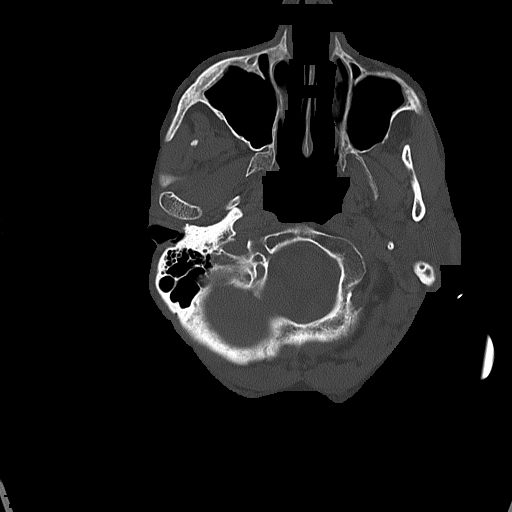
[im 17/81  bone]
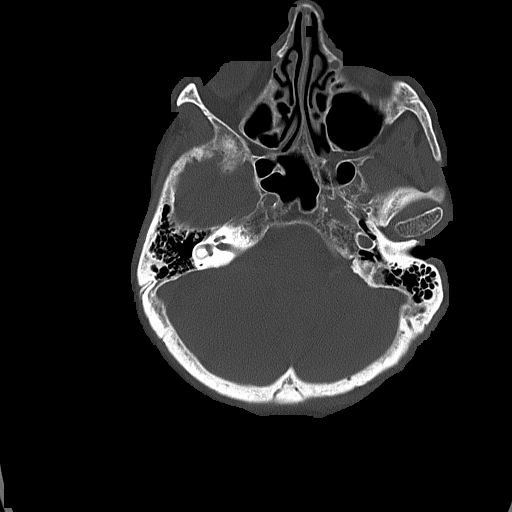
[im 25/81  bone]
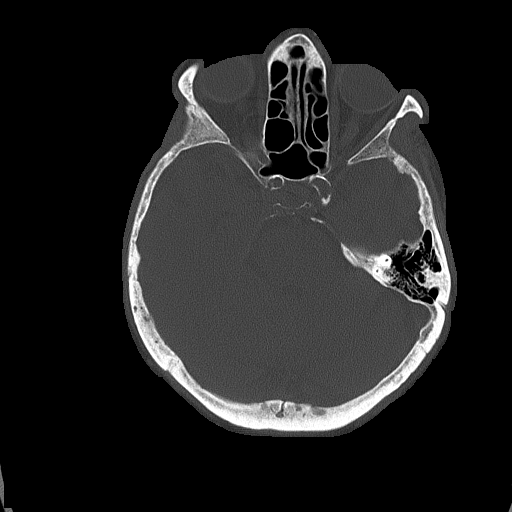
[im 37/81  bone]
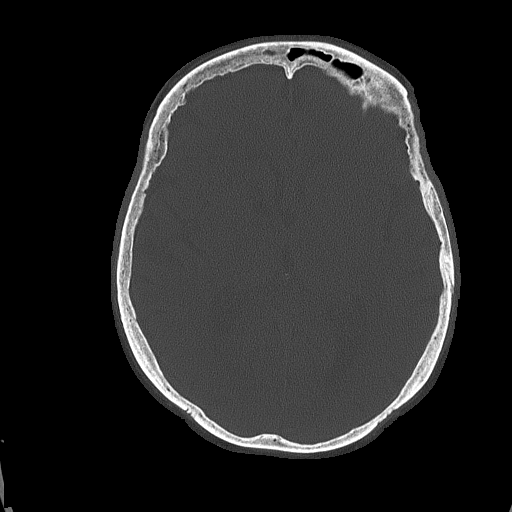

[Series 4: coronal soft tissue · coronal · 0.34mm/px · 3 of 66 slices shown]
[im 25/66  brain]
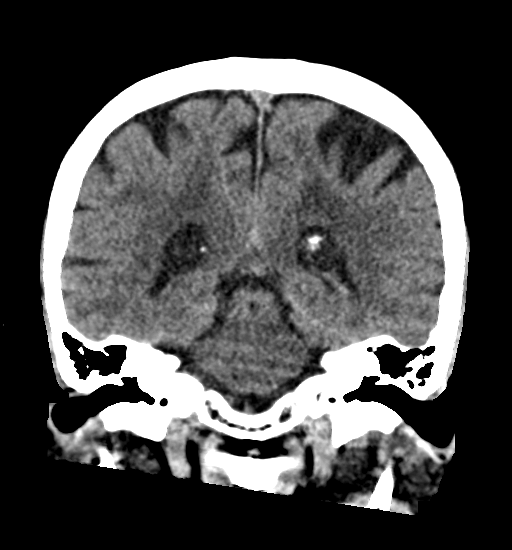
[im 30/66  brain]
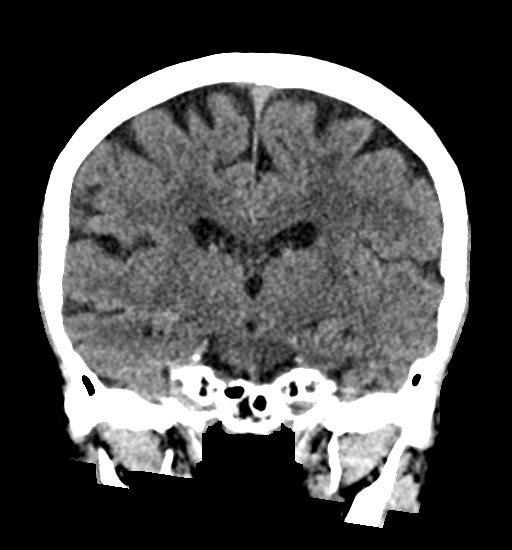
[im 36/66  brain]
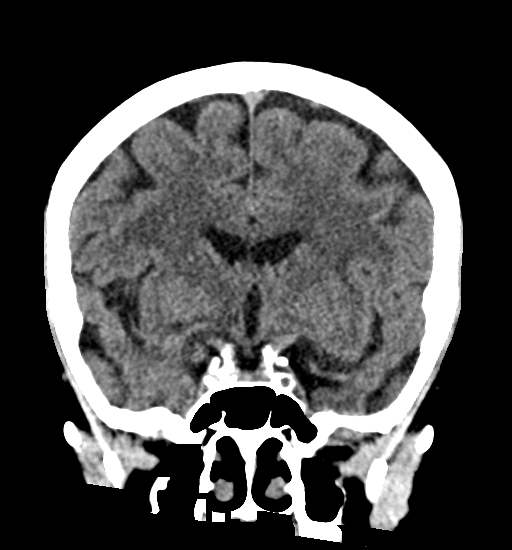

[Series 5: sagittal soft tissue · sagittal · 0.37mm/px · 3 of 55 slices shown]
[im 20/55  brain]
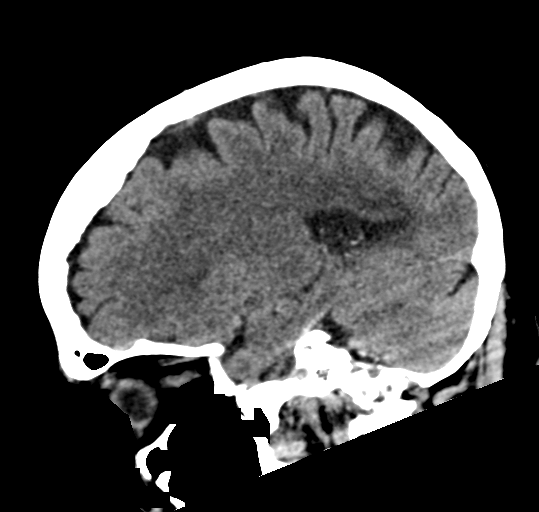
[im 28/55  brain]
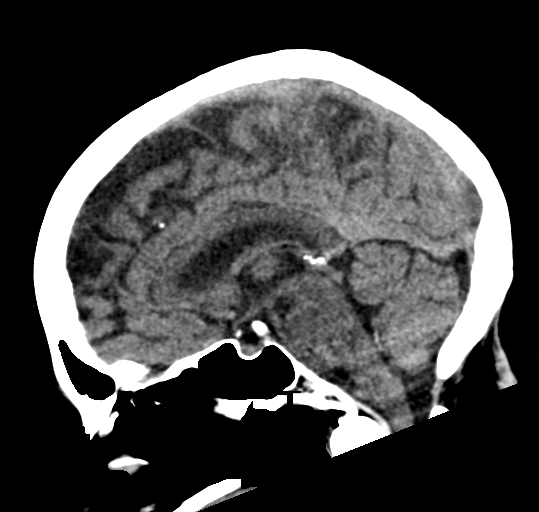
[im 36/55  brain]
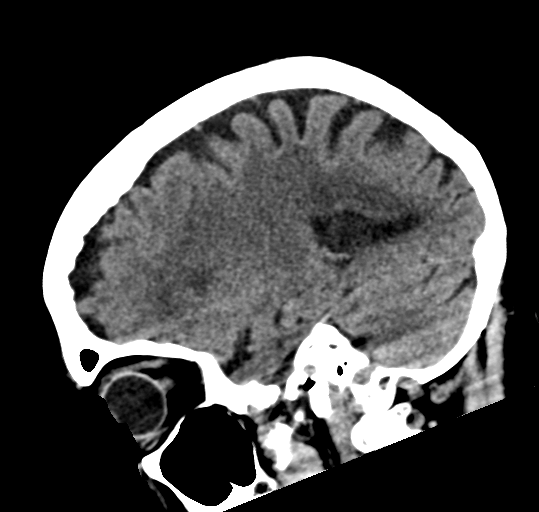

[17 of 47 positions shown; findings below may reference images not displayed]

FINDINGS: Brain: There is no evidence of acute intracranial hemorrhage,
extra-axial fluid collection, or acute infarct.

There is mild global parenchymal volume loss. The ventricles are not
enlarged. There is patchy hypodensity throughout the subcortical and
periventricular white matter, nonspecific but likely reflecting
sequela of chronic white matter microangiopathy. There is a remote
infarct in the right basal ganglia.

There is no solid mass lesion.  There is no midline shift.

Vascular: There is calcification of the bilateral cavernous ICAs.

Skull: Normal. Negative for fracture or focal lesion.

Sinuses/Orbits: The imaged paranasal sinuses are clear. Bilateral
lens implants are in place. The globes and orbits are otherwise
unremarkable.

Other: None.
IMPRESSION: 1. No acute intracranial pathology.
2. Remote infarct in the right basal ganglia and chronic white
matter microangiopathy.

## 2023-04-30 IMAGING — US US EXTREM LOW VENOUS*R*
1 series · 13 of 24 positions shown · non-contrast
Comparison: None.

CLINICAL DATA: Right lower extremity pain for 3 days



[Series 1: lev · 13 of 37 slices shown]
[im 1/37]
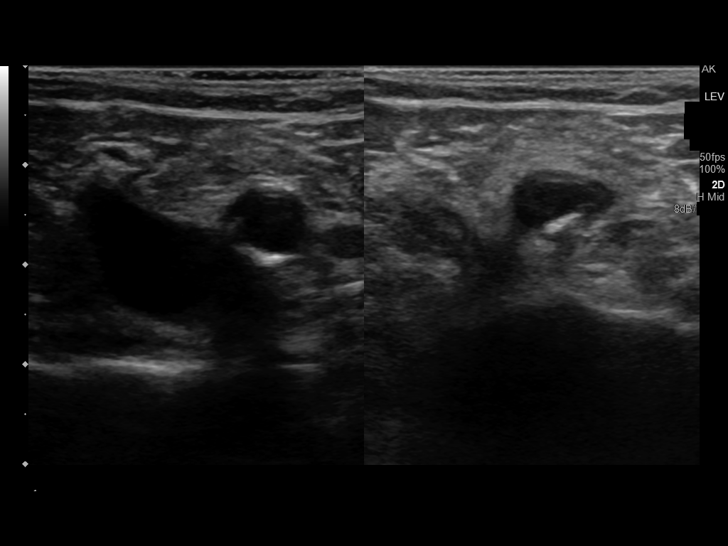
[im 4/37]
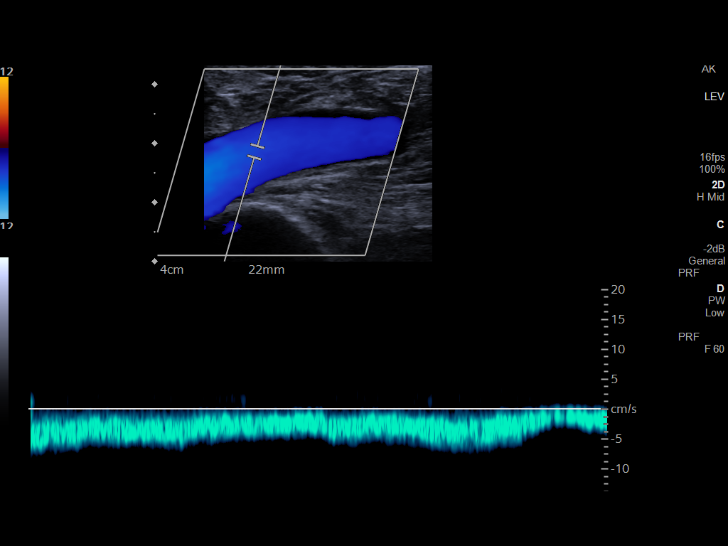
[im 7/37]
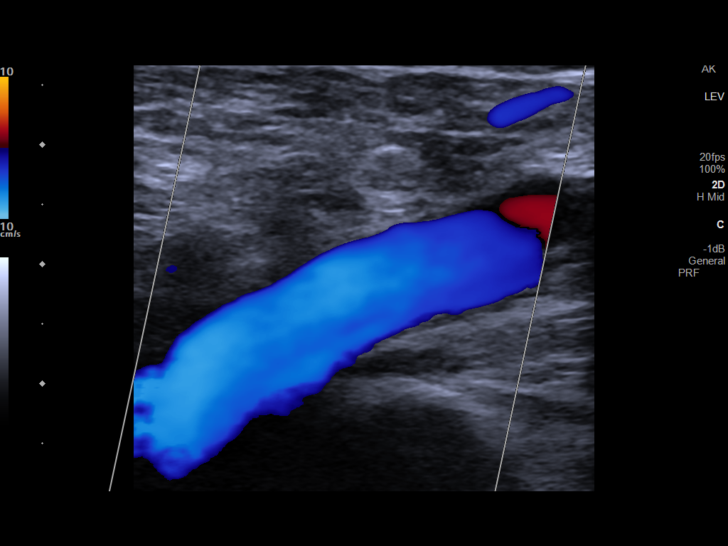
[im 10/37]
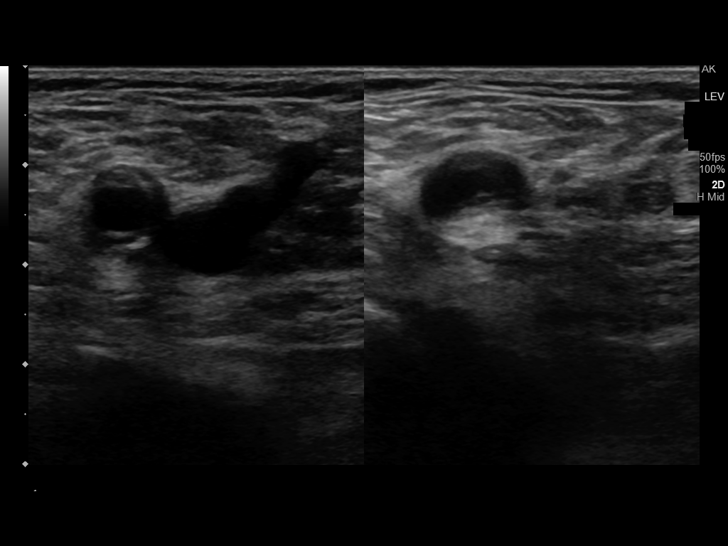
[im 13/37]
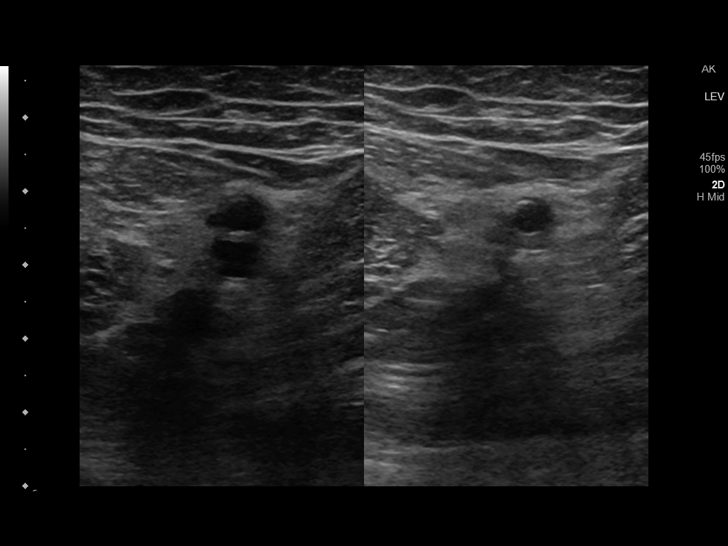
[im 16/37]
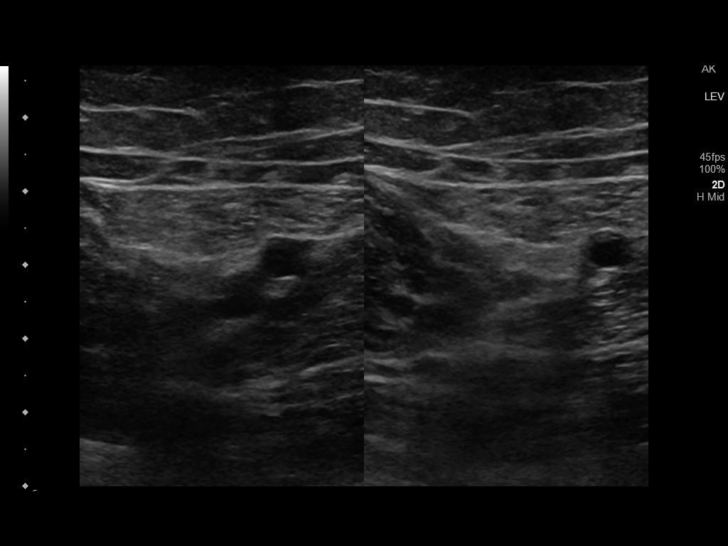
[im 19/37]
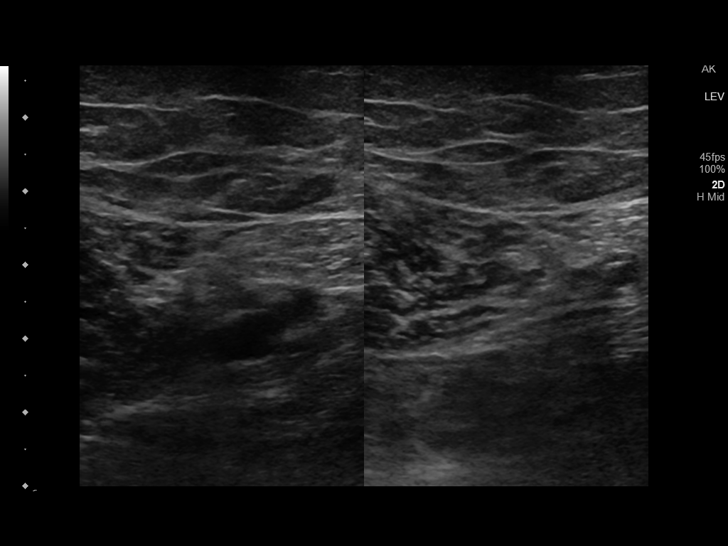
[im 21/37]
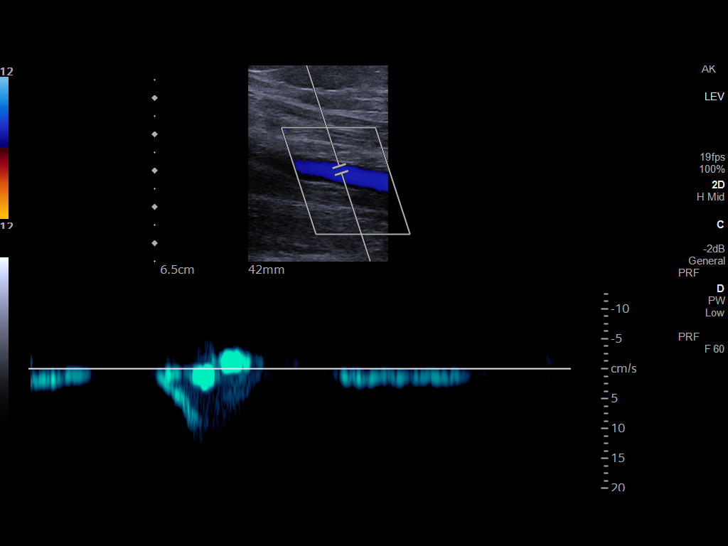
[im 24/37]
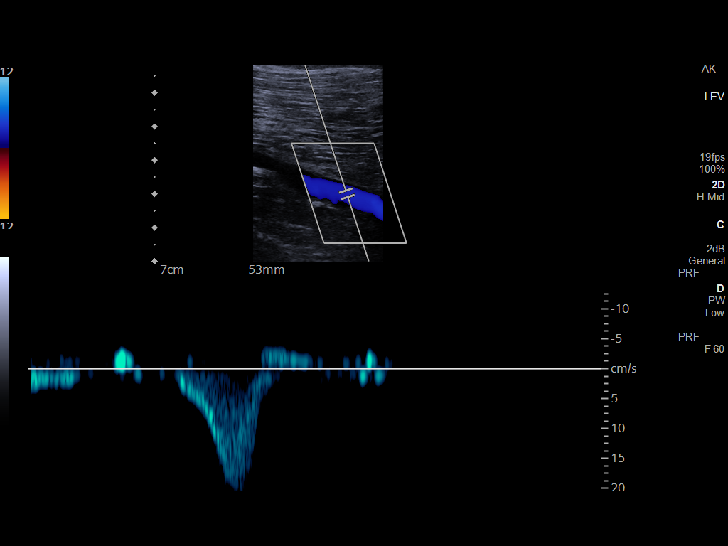
[im 27/37]
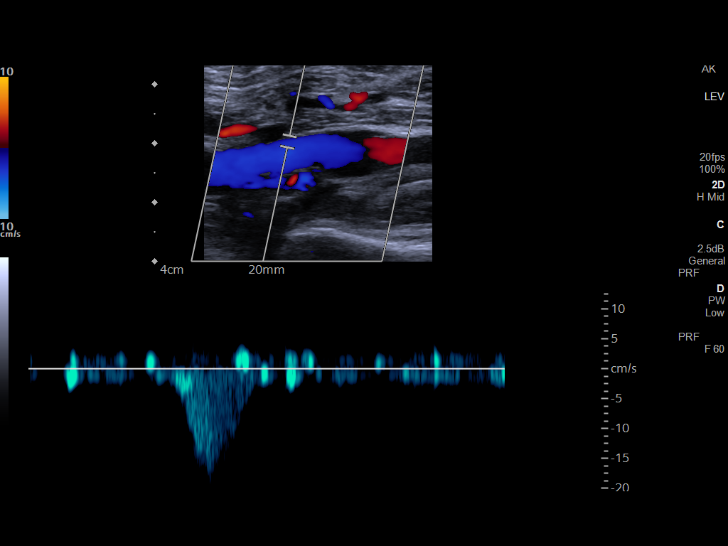
[im 30/37]
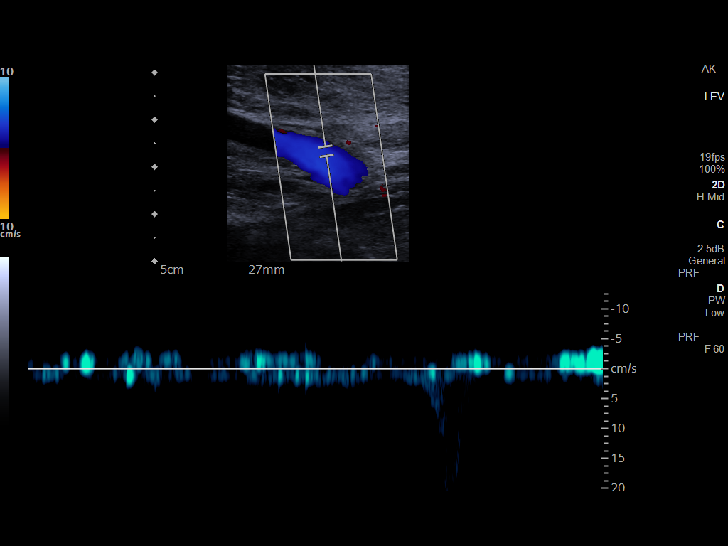
[im 33/37]
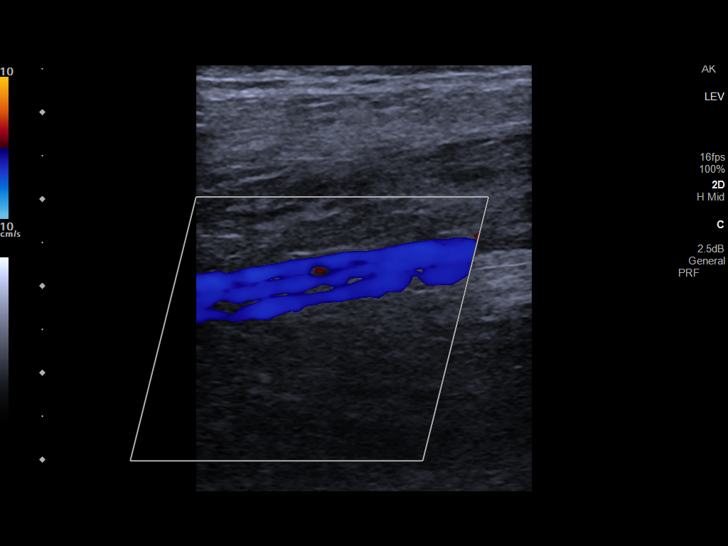
[im 37/37]
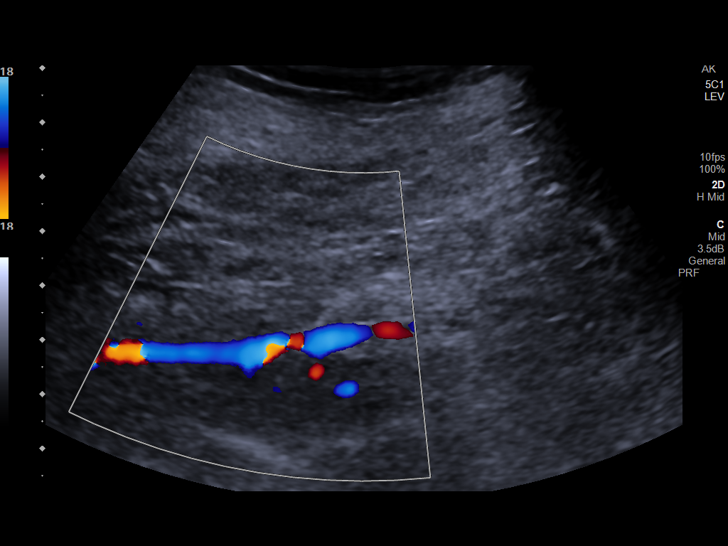

[13 of 24 positions shown; findings below may reference images not displayed]

FINDINGS: Contralateral Common Femoral Vein: Respiratory phasicity is normal
and symmetric with the symptomatic side. No evidence of thrombus.
Normal compressibility.

Common Femoral Vein: No evidence of thrombus. Normal
compressibility, respiratory phasicity and response to augmentation.

Saphenofemoral Junction: No evidence of thrombus. Normal
compressibility and flow on color Doppler imaging.

Profunda Femoral Vein: No evidence of thrombus. Normal
compressibility and flow on color Doppler imaging.

Femoral Vein: No evidence of thrombus. Normal compressibility,
respiratory phasicity and response to augmentation.

Popliteal Vein: No evidence of thrombus. Normal compressibility,
respiratory phasicity and response to augmentation.

Calf Veins: No evidence of thrombus. Normal compressibility and flow
on color Doppler imaging.
IMPRESSION: No evidence of deep venous thrombosis.

## 2023-05-06 ENCOUNTER — Other Ambulatory Visit: Payer: Self-pay | Admitting: Internal Medicine

## 2023-05-26 ENCOUNTER — Ambulatory Visit: Payer: Medicare Other

## 2023-10-06 ENCOUNTER — Encounter: Payer: Self-pay | Admitting: Internal Medicine

## 2023-10-06 ENCOUNTER — Ambulatory Visit: Payer: Medicare Other | Admitting: Internal Medicine

## 2023-10-06 VITALS — BP 122/80 | HR 72 | Temp 99.8°F | Ht 63.5 in | Wt 145.0 lb

## 2023-10-06 DIAGNOSIS — J101 Influenza due to other identified influenza virus with other respiratory manifestations: Secondary | ICD-10-CM

## 2023-10-06 DIAGNOSIS — R051 Acute cough: Secondary | ICD-10-CM | POA: Diagnosis not present

## 2023-10-06 LAB — POCT INFLUENZA A/B
Influenza A, POC: POSITIVE — AB
Influenza B, POC: NEGATIVE

## 2023-10-06 LAB — POC COVID19 BINAXNOW: SARS Coronavirus 2 Ag: NEGATIVE

## 2023-10-06 MED ORDER — OSELTAMIVIR PHOSPHATE 75 MG PO CAPS: 75.0000 mg | ORAL_CAPSULE | Freq: Two times a day (BID) | ORAL | 0 refills | Status: DC

## 2023-10-06 MED ORDER — BENZONATATE 200 MG PO CAPS: 200.0000 mg | ORAL_CAPSULE | Freq: Three times a day (TID) | ORAL | 0 refills | Status: DC | PRN

## 2023-10-06 NOTE — Progress Notes (Signed)
Subjective:    Patient ID: Kylie Kim, female    DOB: 13-May-1928, 89 y.o.   MRN: [id number]  HPI Here with caregiver Terri--due to respiratory illness  Started with illness---some cough and sneeze for a while Clearly worsened last night though Husband also sick No fever, chills, sweats Some achy feeling--and chest hurts with the cough Did have flu shot this year  Using cough drops No meds thus far  Current Outpatient Medications on File Prior to Visit  Medication Sig Dispense Refill   amLODipine (NORVASC) 5 MG tablet TAKE 1 TABLET BY MOUTH EVERY DAY 90 tablet 3   Multiple Vitamins-Minerals (ABC PLUS SENIOR) TABS Take 1 tablet by mouth daily.     polyethylene glycol powder (GLYCOLAX/MIRALAX) powder Take 17 g by mouth every other day.     No current facility-administered medications on file prior to visit.    Allergies  Allergen Reactions   Alendronate Sodium     REACTION: unspecified   Oxycodone-Acetaminophen     REACTION: unspecified    Past Medical History:  Diagnosis Date   Essential and other specified forms of tremor    History of breast cancer 07/96   Hyperlipidemia    Hypertension    Osteoporosis    Poor memory    Post herpetic neuralgia 01/07   Raynaud's disease    TIA (transient ischemic attack)    "several".  No deficits    Past Surgical History:  Procedure Laterality Date   ABDOMINAL HYSTERECTOMY     BREAST LUMPECTOMY  07/96   surgery at [facility]   CATARACT EXTRACTION W/PHACO Left 06/10/2020   Procedure: CATARACT EXTRACTION PHACO AND INTRAOCULAR LENS PLACEMENT (IOC) LEFT 10.81 01:05.1;  Surgeon: Galen Manila, MD;  Location: [facility];  Service: Ophthalmology;  Laterality: Left;   CATARACT EXTRACTION W/PHACO Right 07/01/2020   Procedure: CATARACT EXTRACTION PHACO AND INTRAOCULAR LENS PLACEMENT (IOC) RIGHT;  Surgeon: Galen Manila, MD;  Location: [facility];  Service: Ophthalmology;  Laterality: Right;  10.74 1:03.7     Family History  Problem Relation Age of Onset   Cancer Sister     Social History   Socioeconomic History   Marital status: Married    Spouse name: Not on file   Number of children: 2   Years of education: Not on file   Highest education level: Not on file  Occupational History   Occupation: retired [employer]  Tobacco Use   Smoking status: Never   Smokeless tobacco: Never  Vaping Use   Vaping status: Never Used  Substance and Sexual Activity   Alcohol use: Yes    Comment: may have drink 1x/month   Drug use: No   Sexual activity: Never  Other Topics Concern   Not on file  Social History Narrative   Has living will   Would want husband, then son Scottie/daughter Andrey Campanile, should make health care decisions. (not positive she has health care POA)   Would accept resuscitation attempts but no prolonged ventilation   Does not want tube feeds if cognitively unaware      Social Drivers of Health   Financial Resource Strain: Not on file  Food Insecurity: Not on file  Transportation Needs: Not on file  Physical Activity: Not on file  Stress: Not on file  Social Connections: Not on file  Intimate Partner Violence: Not on file   Review of Systems No N/V Able to eat    Objective:   Physical Exam Constitutional:  Appearance: Normal appearance.     Comments: Coarse cough  HENT:     Head:     Comments: No sinus tenderness    Right Ear: Tympanic membrane and ear canal normal.     Left Ear: Tympanic membrane and ear canal normal.     Mouth/Throat:     Pharynx: No oropharyngeal exudate or posterior oropharyngeal erythema.  Pulmonary:     Effort: Pulmonary effort is normal.     Breath sounds: Normal breath sounds. No wheezing or rales.  Musculoskeletal:     Cervical back: Neck supple.  Lymphadenopathy:     Cervical: No cervical adenopathy.  Neurological:     Mental Status: She is alert.            Assessment & Plan:

## 2023-10-06 NOTE — Assessment & Plan Note (Signed)
Flu A positive and COVID negative May be attenuated but at high risk Will give tamiflu for 5 days Benzonatate for cough ER if worsens

## 2023-10-10 ENCOUNTER — Telehealth: Payer: Self-pay

## 2023-10-10 NOTE — Telephone Encounter (Signed)
Called back and left another message Glad she is at least doing a little better

## 2023-10-10 NOTE — Telephone Encounter (Signed)
Copied from CRM [id number]. Topic: General - Other >> Oct 10, 2023  2:43 PM Kylie Kim wrote: Reason for CRM: [name] called and stated that patient has the flu but she is currently at home taking medications. She stated that she is eating and drinking and has no fever but she does still have a cough. Please advise.

## 2023-10-24 MED ORDER — ALPRAZOLAM 0.25 MG PO TABS: 0.1250 mg | ORAL_TABLET | Freq: Two times a day (BID) | ORAL | 0 refills | Status: DC | PRN

## 2023-10-24 NOTE — Telephone Encounter (Signed)
 Husband died this morning Called and passed on condolences. Also spoke to Terri her aide---very upset Requested something to calm her She has someone with her all the time---will give lowest dose alprazolam and monitor for instability while walking

## 2023-11-07 ENCOUNTER — Ambulatory Visit (INDEPENDENT_AMBULATORY_CARE_PROVIDER_SITE_OTHER): Payer: Medicare Other | Admitting: Internal Medicine

## 2023-11-07 ENCOUNTER — Encounter: Payer: Self-pay | Admitting: Internal Medicine

## 2023-11-07 VITALS — BP 116/78 | HR 64 | Temp 97.3°F | Ht 63.5 in | Wt 148.0 lb

## 2023-11-07 DIAGNOSIS — F0390 Unspecified dementia without behavioral disturbance: Secondary | ICD-10-CM | POA: Insufficient documentation

## 2023-11-07 DIAGNOSIS — F03A3 Unspecified dementia, mild, with mood disturbance: Secondary | ICD-10-CM | POA: Diagnosis not present

## 2023-11-07 DIAGNOSIS — F0153 Vascular dementia, unspecified severity, with mood disturbance: Secondary | ICD-10-CM | POA: Insufficient documentation

## 2023-11-07 MED ORDER — MEMANTINE HCL 5 MG PO TABS: 5.0000 mg | ORAL_TABLET | Freq: Two times a day (BID) | ORAL | 3 refills | Status: DC

## 2023-11-07 NOTE — Progress Notes (Signed)
 Subjective:    Patient ID: Kylie Kim, female    DOB: [birth year], 89 y.o.   MRN: [id number]  HPI Here with caregiver Kylie Kim---due to worsening memory loss Especially bad since husband just recently died--but predated this  Forgets children coming in for funeral Serious issues with short term memory--asks the same things over and over Then forgot that husband died This is not new  Going next week to visit daughter in [place] Not sleeping well  Did take 3 of the xanax--not sure it helps Kylie Kim thinks she should start the medication that husband used (memantine)  No longer having night aides Kylie Kim is there 2 days a week---and other aides there daily during the day  Current Outpatient Medications on File Prior to Visit  Medication Sig Dispense Refill   ALPRAZolam (XANAX) 0.25 MG tablet Take 0.5-1 tablets (0.125-0.25 mg total) by mouth 2 (two) times daily as needed for anxiety. 10 tablet 0   amLODipine (NORVASC) 5 MG tablet TAKE 1 TABLET BY MOUTH EVERY DAY 90 tablet 3   Multiple Vitamins-Minerals (ABC PLUS SENIOR) TABS Take 1 tablet by mouth daily.     polyethylene glycol powder (GLYCOLAX/MIRALAX) powder Take 17 g by mouth every other day.     No current facility-administered medications on file prior to visit.    Allergies  Allergen Reactions   Alendronate Sodium     REACTION: unspecified   Oxycodone-Acetaminophen     REACTION: unspecified    Past Medical History:  Diagnosis Date   Essential and other specified forms of tremor    History of breast cancer 07/96   Hyperlipidemia    Hypertension    Osteoporosis    Poor memory    Post herpetic neuralgia 01/07   Raynaud's disease    TIA (transient ischemic attack)    "several".  No deficits    Past Surgical History:  Procedure Laterality Date   ABDOMINAL HYSTERECTOMY     BREAST LUMPECTOMY  07/96   surgery at [facility]   CATARACT EXTRACTION W/PHACO Left 06/10/2020   Procedure: CATARACT EXTRACTION PHACO AND  INTRAOCULAR LENS PLACEMENT (IOC) LEFT 10.81 01:05.1;  Surgeon: Galen Manila, MD;  Location: [facility];  Service: Ophthalmology;  Laterality: Left;   CATARACT EXTRACTION W/PHACO Right 07/01/2020   Procedure: CATARACT EXTRACTION PHACO AND INTRAOCULAR LENS PLACEMENT (IOC) RIGHT;  Surgeon: Galen Manila, MD;  Location: [facility];  Service: Ophthalmology;  Laterality: Right;  10.74 1:03.7    Family History  Problem Relation Age of Onset   Cancer Sister     Social History   Socioeconomic History   Marital status: Married    Spouse name: Not on file   Number of children: 2   Years of education: Not on file   Highest education level: Not on file  Occupational History   Occupation: retired [employer]  Tobacco Use   Smoking status: Never   Smokeless tobacco: Never  Vaping Use   Vaping status: Never Used  Substance and Sexual Activity   Alcohol use: Yes    Comment: may have drink 1x/month   Drug use: No   Sexual activity: Never  Other Topics Concern   Not on file  Social History Narrative   Has living will   Would want husband, then son Kylie Kim/daughter Kylie Kim, should make health care decisions. (not positive she has health care POA)   Would accept resuscitation attempts but no prolonged ventilation   Does not want tube feeds if cognitively unaware  Social Drivers of Corporate investment banker Strain: Not on file  Food Insecurity: Not on file  Transportation Needs: Not on file  Physical Activity: Not on file  Stress: Not on file  Social Connections: Not on file  Intimate Partner Violence: Not on file   Review of Systems Wakes up shaking in the morning Eating pretty good     Objective:   Physical Exam Constitutional:      Appearance: Normal appearance.  Neurological:     Mental Status: She is alert.  Psychiatric:     Comments: Limited insight but realizes her memory is not good            Assessment & Plan:

## 2023-11-07 NOTE — Assessment & Plan Note (Addendum)
 Likely vascular vs early Alzheimers Is anxious and restless at night Reasonable to try memantine 5 bid for mood Recommended looking into AL near one of her children--or if daughter can have her move in to basement apt Should not drive

## 2023-11-08 LAB — CBC
HCT: 40.1 % (ref 36.0–46.0)
Hemoglobin: 13.3 g/dL (ref 12.0–15.0)
MCHC: 33.3 g/dL (ref 30.0–36.0)
MCV: 92.7 fl (ref 78.0–100.0)
Platelets: 214 10*3/uL (ref 150.0–400.0)
RBC: 4.32 Mil/uL (ref 3.87–5.11)
RDW: 14.8 % (ref 11.5–15.5)
WBC: 5.9 10*3/uL (ref 4.0–10.5)

## 2023-11-08 LAB — COMPREHENSIVE METABOLIC PANEL
ALT: 17 U/L (ref 0–35)
AST: 22 U/L (ref 0–37)
Albumin: 4.1 g/dL (ref 3.5–5.2)
Alkaline Phosphatase: 103 U/L (ref 39–117)
BUN: 10 mg/dL (ref 6–23)
CO2: 27 meq/L (ref 19–32)
Calcium: 9.4 mg/dL (ref 8.4–10.5)
Chloride: 102 meq/L (ref 96–112)
Creatinine, Ser: 0.78 mg/dL (ref 0.40–1.20)
GFR: 65.43 mL/min (ref >60.00)
Glucose, Bld: 86 mg/dL (ref 70–99)
Potassium: 3.8 meq/L (ref 3.5–5.1)
Sodium: 138 meq/L (ref 135–145)
Total Bilirubin: 0.4 mg/dL (ref 0.2–1.2)
Total Protein: 6.5 g/dL (ref 6.0–8.3)

## 2023-11-08 LAB — TSH: TSH: 4.8 u[IU]/mL (ref 0.35–5.50)

## 2023-11-08 LAB — VITAMIN B12: Vitamin B-12: 436 pg/mL (ref 211–911)

## 2023-11-09 ENCOUNTER — Encounter: Payer: Self-pay | Admitting: Internal Medicine

## 2023-11-30 ENCOUNTER — Other Ambulatory Visit: Payer: Self-pay | Admitting: Internal Medicine

## 2023-12-26 ENCOUNTER — Other Ambulatory Visit: Payer: Self-pay | Admitting: Internal Medicine

## 2024-01-19 ENCOUNTER — Other Ambulatory Visit: Payer: Self-pay | Admitting: Internal Medicine

## 2024-02-28 ENCOUNTER — Encounter: Payer: Self-pay | Admitting: Internal Medicine

## 2024-02-28 ENCOUNTER — Ambulatory Visit (INDEPENDENT_AMBULATORY_CARE_PROVIDER_SITE_OTHER): Admitting: Internal Medicine

## 2024-02-28 VITALS — BP 118/70 | HR 82 | Temp 98.6°F | Ht 63.5 in | Wt 145.0 lb

## 2024-02-28 DIAGNOSIS — F01A3 Vascular dementia, mild, with mood disturbance: Secondary | ICD-10-CM | POA: Diagnosis not present

## 2024-02-28 MED ORDER — MEMANTINE HCL 10 MG PO TABS: 10.0000 mg | ORAL_TABLET | Freq: Two times a day (BID) | ORAL | 3 refills | Status: AC

## 2024-02-28 NOTE — Assessment & Plan Note (Signed)
 Mild Some improvement in agitation with memantine ---will increase dose to 10mg  bid Now has granddaughter/husband---living with her This makes it more sustainable for her to stay home

## 2024-02-28 NOTE — Progress Notes (Signed)
 Subjective:    Patient ID: Kylie Kim, female    DOB: 1928/03/15, 89 y.o.   MRN: [id number]  HPI Here for follow up of multiple medical issues--with daughter in law  Ongoing memory issues Forgot we moved back here from [place] office Still can do ADLs---but needs supervision for meals and medications Granddaughter and her husband are now living with her Not alone for more than 2-3 hours at a time  She still gets upset and lonely at times Family does support and take her places etc Regular crying spells--misses husband Memantine  may help a little---has long standing anxiety/worry. But obviously worse with her memory issues  Current Outpatient Medications on File Prior to Visit  Medication Sig Dispense Refill   amLODipine  (NORVASC ) 5 MG tablet TAKE 1 TABLET BY MOUTH EVERY DAY 90 tablet 3   memantine  (NAMENDA ) 5 MG tablet TAKE 1 TABLET BY MOUTH TWICE A DAY 180 tablet 2   Multiple Vitamins-Minerals (ABC PLUS SENIOR) TABS Take 1 tablet by mouth daily.     Senna (SENOKOT LAXATIVE GUMMIES) 8.7 MG CHEW Chew 2 each by mouth daily.     No current facility-administered medications on file prior to visit.    Allergies  Allergen Reactions   Alendronate Sodium     REACTION: unspecified   Oxycodone-Acetaminophen      REACTION: unspecified    Past Medical History:  Diagnosis Date   Essential and other specified forms of tremor    History of breast cancer 07/96   Hyperlipidemia    Hypertension    Osteoporosis    Poor memory    Post herpetic neuralgia 01/07   Raynaud's disease    TIA (transient ischemic attack)    several.  No deficits    Past Surgical History:  Procedure Laterality Date   ABDOMINAL HYSTERECTOMY     BREAST LUMPECTOMY  07/96   surgery at [facility]   CATARACT EXTRACTION W/PHACO Left 06/10/2020   Procedure: CATARACT EXTRACTION PHACO AND INTRAOCULAR LENS PLACEMENT (IOC) LEFT 10.81 01:05.1;  Surgeon: Jaye Fallow, MD;  Location: [facility];   Service: Ophthalmology;  Laterality: Left;   CATARACT EXTRACTION W/PHACO Right 07/01/2020   Procedure: CATARACT EXTRACTION PHACO AND INTRAOCULAR LENS PLACEMENT (IOC) RIGHT;  Surgeon: Jaye Fallow, MD;  Location: [facility];  Service: Ophthalmology;  Laterality: Right;  10.74 1:03.7    Family History  Problem Relation Age of Onset   Cancer Sister     Social History   Socioeconomic History   Marital status: Married    Spouse name: Not on file   Number of children: 2   Years of education: Not on file   Highest education level: Not on file  Occupational History   Occupation: retired [employer]  Tobacco Use   Smoking status: Never   Smokeless tobacco: Never  Vaping Use   Vaping status: Never Used  Substance and Sexual Activity   Alcohol use: Yes    Comment: may have drink 1x/month   Drug use: No   Sexual activity: Never  Other Topics Concern   Not on file  Social History Narrative   Has living will   Would want husband, then son Scottie/daughter Particia, should make health care decisions. (not positive she has health care POA)   Would accept resuscitation attempts but no prolonged ventilation   Does not want tube feeds if cognitively unaware      Social Drivers of Health   Financial Resource Strain: Not on file  Food Insecurity: Not on  file  Transportation Needs: Not on file  Physical Activity: Not on file  Stress: Not on file  Social Connections: Not on file  Intimate Partner Violence: Not on file   Review of Systems Eating okay--appetite is down Weight holding Sleeps well     Objective:   Physical Exam Constitutional:      Appearance: Normal appearance.   Neurological:     Mental Status: She is alert.     Comments: Reasonable engagement   Psychiatric:        Mood and Affect: Mood normal.        Behavior: Behavior normal.            Assessment & Plan:

## 2024-02-28 NOTE — Progress Notes (Signed)
 Will Dr KANDICE Accept this patient as TOC from [place]?  He Recommends follow up in 6 months

## 2024-03-20 ENCOUNTER — Telehealth: Payer: Self-pay

## 2024-03-20 NOTE — Progress Notes (Signed)
 Complex Care Management Note Care Guide Note  03/20/2024 Name: Elysia Grand MRN: [id number] DOB: 1928-06-28   Complex Care Management Outreach Attempts: An unsuccessful telephone outreach was attempted today to offer the patient information about available complex care management services.  Follow Up Plan:  Additional outreach attempts will be made to offer the patient complex care management information and services.   Encounter Outcome:  No Answer  Dreama [facility]  [facility], [facility] Care Management Assistant Direct Dial: [phone]  Fax: [phone]

## 2024-03-24 ENCOUNTER — Encounter: Payer: Self-pay | Admitting: Internal Medicine

## 2024-03-28 ENCOUNTER — Ambulatory Visit (INDEPENDENT_AMBULATORY_CARE_PROVIDER_SITE_OTHER): Payer: Medicare Other | Admitting: Internal Medicine

## 2024-03-28 ENCOUNTER — Encounter: Payer: Self-pay | Admitting: Internal Medicine

## 2024-03-28 VITALS — BP 138/84 | HR 82 | Temp 98.5°F | Ht 63.5 in | Wt 143.0 lb

## 2024-03-28 DIAGNOSIS — K5909 Other constipation: Secondary | ICD-10-CM

## 2024-03-28 DIAGNOSIS — F01A3 Vascular dementia, mild, with mood disturbance: Secondary | ICD-10-CM

## 2024-03-28 DIAGNOSIS — Z0000 Encounter for general adult medical examination without abnormal findings: Secondary | ICD-10-CM | POA: Diagnosis not present

## 2024-03-28 DIAGNOSIS — I10 Essential (primary) hypertension: Secondary | ICD-10-CM | POA: Diagnosis not present

## 2024-03-28 NOTE — Assessment & Plan Note (Signed)
 BP Readings from Last 3 Encounters:  03/28/24 138/84  02/28/24 118/70  11/07/23 116/78   Doing well on amlodipine  5mg  daily

## 2024-03-28 NOTE — Progress Notes (Signed)
 Vision Screening   Right eye Left eye Both eyes  Without correction     With correction 20/40 20/20 20/20   Hearing Screening - Comments:: Did not pass whisper test

## 2024-03-28 NOTE — Assessment & Plan Note (Signed)
 Does well with senna

## 2024-03-28 NOTE — Assessment & Plan Note (Signed)
 I have personally reviewed the Medicare Annual Wellness questionnaire and have noted 1. The patient's medical and social history 2. Their use of alcohol, tobacco or illicit drugs 3. Their current medications and supplements 4. The patient's functional ability including ADL's, fall risks, home safety risks and hearing or visual             impairment. 5. Diet and physical activities 6. Evidence for depression or mood disorders  The patients weight, height, BMI and visual acuity have been recorded in the chart I have made referrals, counseling and provided education to the patient based review of the above and I have provided the pt with a written personalized care plan for preventive services.  I have provided you with a copy of your personalized plan for preventive services. Please take the time to review along with your updated medication list.  No cancer screening at her age Discussed trying chair or other home exercises Td/RSV soon Flu/COVID vaccines in the fall

## 2024-03-28 NOTE — Patient Instructions (Signed)
 I would recommend at tetanus booster (Td) and RSV at the pharmacy. Then flu and COVID vaccines in the fall---September or October

## 2024-03-28 NOTE — Progress Notes (Signed)
 Complex Care Management Note  Care Guide Note 03/28/2024 Name: Kylie Kim MRN: [id number] DOB: 1927/12/22  [name] is a 89 y.o. year old female who sees Jimmy Charlie FERNS, MD for primary care. I reached out to Aunna Bloodworth by phone today to offer complex care management services.  [name]   The Complex Care Management services include support from the care team which includes your Nurse Care Manager, Clinical Social Worker, or Pharmacist.  The Complex Care Management team is here to help remove barriers to the health concerns and goals most important to you. Complex Care Management services are voluntary, and the patient may decline or stop services at any time by request to their care team member.   Complex Care Management Consent Status: Patient agreed to services and verbal consent obtained.   Follow up plan:  Telephone appointment with complex care management team member scheduled for:  04/23/24 at 9:30 a.m.   Encounter Outcome:  Patient Scheduled  [facility]reama [facility]  [facility], [facility] Care Management Assistant Direct Dial: [phone]  Fax: [phone]

## 2024-03-28 NOTE — Progress Notes (Signed)
 Subjective:    Patient ID: Kylie Kim, female    DOB: [birth year], 89 y.o.   MRN: [id number]  HPI Here for Medicare wellness visit and follow up of chronic health conditions With granddaughter--Emma Reviewed advanced directives Reviewed other doctors---Dr Doretta, Dr Deyanne, Mr Darr--ortho No hospitalizations or surgery in the past year Does walk some--mostly in stores. No set exercise No falls Will have some down times--grieves husband and feels sorry for myself. No anhedonia Vision is okay Hearing is not great per granddaughter--patient doesn't note concerns No tobacco Rare glass of wine  Has been doing better on the memantine  Granddaughter and husband are now living with her--and they both work from home At most alone for 2 hours  Doesn't drive.  Still independent with ADLs They do the instrumental ADLs for the most part  No chest pain  May have some brief SOB---if she gets confused and anxious. Goes away with time (mostly like if she forgets a birthday, etc) No dizziness or syncope No edema No headaches  Current Outpatient Medications on File Prior to Visit  Medication Sig Dispense Refill   amLODipine  (NORVASC ) 5 MG tablet TAKE 1 TABLET BY MOUTH EVERY DAY 90 tablet 3   memantine  (NAMENDA ) 10 MG tablet Take 1 tablet (10 mg total) by mouth 2 (two) times daily. 180 tablet 3   Multiple Vitamins-Minerals (ABC PLUS SENIOR) TABS Take 1 tablet by mouth daily.     Senna (SENOKOT LAXATIVE GUMMIES) 8.7 MG CHEW Chew 2 each by mouth daily.     No current facility-administered medications on file prior to visit.    Allergies  Allergen Reactions   Alendronate Sodium     REACTION: unspecified   Oxycodone-Acetaminophen      REACTION: unspecified    Past Medical History:  Diagnosis Date   Essential and other specified forms of tremor    History of breast cancer 07/96   Hyperlipidemia    Hypertension    Osteoporosis    Poor memory    Post  herpetic neuralgia 01/07   Raynaud's disease    TIA (transient ischemic attack)    several.  No deficits    Past Surgical History:  Procedure Laterality Date   ABDOMINAL HYSTERECTOMY     BREAST LUMPECTOMY  07/96   surgery at [facility]   CATARACT EXTRACTION W/PHACO Left 06/10/2020   Procedure: CATARACT EXTRACTION PHACO AND INTRAOCULAR LENS PLACEMENT (IOC) LEFT 10.81 01:05.1;  Surgeon: Jaye Fallow, MD;  Location: [facility];  Service: Ophthalmology;  Laterality: Left;   CATARACT EXTRACTION W/PHACO Right 07/01/2020   Procedure: CATARACT EXTRACTION PHACO AND INTRAOCULAR LENS PLACEMENT (IOC) RIGHT;  Surgeon: Jaye Fallow, MD;  Location: [facility];  Service: Ophthalmology;  Laterality: Right;  10.74 1:03.7    Family History  Problem Relation Age of Onset   Cancer Sister     Social History   Socioeconomic History   Marital status: Married    Spouse name: Not on file   Number of children: 2   Years of education: Not on file   Highest education level: Not on file  Occupational History   Occupation: retired [employer]  Tobacco Use   Smoking status: Never   Smokeless tobacco: Never  Vaping Use   Vaping status: Never Used  Substance and Sexual Activity   Alcohol use: Yes    Comment: may have drink 1x/month   Drug use: No   Sexual activity: Never  Other Topics Concern   Not on file  Social History  Narrative   Has living will   Would want husband, then son Scottie/daughter Particia, should make health care decisions. (not positive she has health care POA)   Would accept resuscitation attempts but no prolonged ventilation   Does not want tube feeds if cognitively unaware      Social Drivers of Health   Financial Resource Strain: Not on file  Food Insecurity: Not on file  Transportation Needs: Not on file  Physical Activity: Not on file  Stress: Not on file  Social Connections: Not on file  Intimate Partner Violence: Not on file    Review of  Systems Appetite is good Weight is stable Sleeps okay Wear seat belt Teeth are okay---keeps up with dentist No heartburn  Bowels move okay--chronic constipation helped by senna Voids fine No sig back or joint pains    Objective:   Physical Exam Constitutional:      Appearance: Normal appearance.  HENT:     Mouth/Throat:     Pharynx: No oropharyngeal exudate or posterior oropharyngeal erythema.  Eyes:     Conjunctiva/sclera: Conjunctivae normal.     Pupils: Pupils are equal, round, and reactive to light.  Cardiovascular:     Rate and Rhythm: Normal rate and regular rhythm.     Pulses: Normal pulses.     Heart sounds: No murmur heard.    No gallop.  Pulmonary:     Effort: Pulmonary effort is normal.     Breath sounds: Normal breath sounds. No wheezing or rales.  Abdominal:     Palpations: Abdomen is soft.     Tenderness: There is no abdominal tenderness.  Musculoskeletal:     Cervical back: Neck supple.     Right lower leg: No edema.     Left lower leg: No edema.  Lymphadenopathy:     Cervical: No cervical adenopathy.  Skin:    Findings: No rash.  Neurological:     General: No focal deficit present.     Mental Status: She is alert and oriented to person, place, and time.  Psychiatric:        Mood and Affect: Mood normal.        Behavior: Behavior normal.            Assessment & Plan:

## 2024-03-28 NOTE — Assessment & Plan Note (Signed)
 Much better on the memantine  10mg 

## 2024-04-14 ENCOUNTER — Other Ambulatory Visit: Payer: Self-pay | Admitting: Internal Medicine

## 2024-04-23 ENCOUNTER — Other Ambulatory Visit: Payer: Self-pay | Admitting: *Deleted

## 2024-04-23 NOTE — Patient Instructions (Signed)
 Visit Information  Thank you for taking time to visit with me today. Please don't hesitate to contact me if I can be of assistance to you in the future   Following is a copy of your care plan:   Goals Addressed             This Visit's Progress    COMPLETED: [facility] Social Work Care Plan       Problems:   Care Coordination needs related to Dementia: Vascular - with behavioral disturbance  CSW Clinical Goal(s):   No clinical goals identified, community resources provided   Interventions:  Dementia Care:   Current level of care: Home with other family or significant other(s): family member: patient resides with grand daughter and her spouse, son and daughter in law also assists with patient's daily care  Evaluation of patient safety in current living environment and review of Dementia resources and support. ADL's Assessed needs, level of care concerns, how currently meeting needs and barriers to care- Consideration on in-home help encouraged : options discussed provided in home care agencies for daughter in law for review to assists with daily patient's daily care ie, medication reminders, assistance with ADL's, companionship care Facility  Assessed needs and reviewed facility placement process; as well as the different levels of care-discussed Adult Day Care options as well([facility]-information provided to daughter in Social worker by email Mental Health Depression screen reviewed-confirmed  loss of spouse of over 50 years in February Discussed referral options to connect for ongoing grief therapy: contact number for grief counseling provided through email  Patient Goals/Self-Care Activities:  Review resources provided related to in home care support, Adult Day options and grief counseling   Plan:   No further follow up required: patient's daughter in law to contact this Child psychotherapist with any additional community resource needs         Please call 911 if you are experiencing a Mental Health or Behavioral Health Crisis or need someone to talk to.  Patient verbalizes understanding of instructions and care plan provided today and agrees to view in MyChart. Active MyChart status and patient understanding of how to access instructions and care plan via MyChart confirmed with patient.     Mckinnon Glick, LCSW [place]  [facility], [facility] Licensed Clinical Social Worker  Direct Dial: [phone]

## 2024-04-23 NOTE — Patient Outreach (Signed)
 Complex Care Management   Visit Note  04/23/2024  Name:  Kylie Kim MRN: [id number] DOB: [birth year]  Situation: Referral received for Complex Care Management related to Dementia Care Counseling/Resources  I obtained verbal consent from Caregiver.  Visit completed with caregiver/daughter in law Grayce Glazier  on the phone  Background:   Past Medical History:  Diagnosis Date   Essential and other specified forms of tremor    History of breast cancer 07/96   Hyperlipidemia    Hypertension    Osteoporosis    Poor memory    Post herpetic neuralgia 01/07   Raynaud's disease    TIA (transient ischemic attack)    several.  No deficits    Assessment: Patient Reported Symptoms:  Cognitive Cognitive Status: Alert and oriented to person, place, and time, Normal speech and language skills, Confused or disoriented, Poor judgment in daily scenarios, Struggling with memory recall Cognitive/Intellectual Conditions Management [RPT]: Other Other: vascular dementia   Health Maintenance Behaviors: Annual physical exam, Spiritual practice(s), Healthy diet Health Facilitated by: Rest, Prayer/meditation, Healthy diet  Neurological Neurological Review of Symptoms: No symptoms reported    HEENT        Cardiovascular Cardiovascular Symptoms Reported: No symptoms reported Does patient have uncontrolled Hypertension?: No Cardiovascular Management Strategies: Activity, Adequate rest, Routine screening, Medication therapy Cardiovascular Self-Management Outcome: 4 (good)  Respiratory Respiratory Symptoms Reported: No symptoms reported    Endocrine Endocrine Symptoms Reported: No symptoms reported    Gastrointestinal Gastrointestinal Symptoms Reported: No symptoms reported      Genitourinary Genitourinary Symptoms Reported: No symptoms reported    Integumentary Integumentary Symptoms Reported: No symptoms reported    Musculoskeletal Musculoskelatal Symptoms Reviewed: No symptoms  reported Additional Musculoskeletal Details: patient is very careful and cautious when walking        Psychosocial Psychosocial Symptoms Reported: Anxiety - if selected complete GAD, Depression - if selected complete PHQ 2-9 Additional Psychological Details: panics about not remembering things-currenlty takes medication for anxiety-spouse passed in February- Behavioral Management Strategies: Coping strategies, Medication therapy Major Change/Loss/Stressor/Fears (CP): Death of a loved one Behaviors When Feeling Stressed/Fearful: medication, talks to family about her feelings, cries, shopping, resraurants Techniques to [employer] with Loss/Stress/Change: Diversional activities Quality of Family Relationships: supportive Do you feel physically threatened by others?: No      04/23/2024   10:03 AM  Depression screen PHQ 2/9  Decreased Interest 0  Down, Depressed, Hopeless 1  PHQ - 2 Score 1    There were no vitals filed for this visit.  Medications Reviewed Today     Reviewed by Ermalinda Lenn HERO, LCSW (Social Worker) on 04/23/24 at 1027  Med List Status: <None>   Medication Order Taking? Sig Documenting Provider Last Dose Status Informant  amLODipine  (NORVASC ) 5 MG tablet [id number] Yes TAKE 1 TABLET BY MOUTH EVERY DAY Letvak, Richard I, MD  Active   memantine  (NAMENDA ) 10 MG tablet [id number] Yes Take 1 tablet (10 mg total) by mouth 2 (two) times daily. Jimmy Charlie FERNS, MD  Active   Multiple Vitamins-Minerals (ABC PLUS [place]) TABS [id number] Yes Take 1 tablet by mouth daily. [provider]  Active   Senna (SENOKOT LAXATIVE GUMMIES) 8.7 MG CHEW [id number] Yes Chew 2 each by mouth daily. [provider]  Active             Recommendation:   PCP Follow-up as needed Please review in home care  Adult Day care  and grief counseling options to further assist/support patient   Follow Up  Plan:   Patient's daughter in law provided with community resources to assist/support  patient's daily needs. Daughter law provided with this social workers contact information should any additional issues/concerns arise  Financial controller, [employer] [place]  [facility], [facility] Licensed Clinical Social Worker  Direct Dial: [phone]

## 2024-09-12 ENCOUNTER — Telehealth: Payer: Self-pay

## 2024-09-12 NOTE — Telephone Encounter (Signed)
Ok with TOC 

## 2024-09-12 NOTE — Telephone Encounter (Signed)
 Copied from CRM [id number]. Topic: Appointments - Transfer of Care >> Sep 11, 2024  5:00 PM Shanda MATSU wrote: Pt is requesting to transfer FROM: Dr. Jimmy Pt is requesting to transfer TO: Dr. Vincente Reason for requested transfer: provider left practice It is the responsibility of the team the patient would like to transfer to (Dr. Vincente) to reach out to the patient if for any reason this transfer is not acceptable.

## 2024-10-18 ENCOUNTER — Encounter: Admitting: General Practice
